# Patient Record
Sex: Male | Born: 1945 | Race: White | Marital: Married
Health system: Western US, Academic
[De-identification: ages and names within clinical notes are randomized; demographics above are authoritative.]

## PROBLEM LIST (undated history)

## (undated) DIAGNOSIS — I10 Essential (primary) hypertension: Secondary | ICD-10-CM

---

## 2016-01-10 ENCOUNTER — Encounter (HOSPITAL_COMMUNITY): Payer: Self-pay

## 2016-01-10 ENCOUNTER — Inpatient Hospital Stay (HOSPITAL_COMMUNITY)
Admission: EM | Admit: 2016-01-10 | Discharge: 2016-01-13 | DRG: 638 | Disposition: A | Discharge status: Home / Self Care | Payer: Medicare Other | Attending: Internal Medicine | Admitting: Internal Medicine

## 2016-01-10 DIAGNOSIS — N179 Acute kidney failure, unspecified: Secondary | ICD-10-CM | POA: Diagnosis present

## 2016-01-10 DIAGNOSIS — Z809 Family history of malignant neoplasm, unspecified: Secondary | ICD-10-CM

## 2016-01-10 DIAGNOSIS — I10 Essential (primary) hypertension: Secondary | ICD-10-CM | POA: Diagnosis present

## 2016-01-10 DIAGNOSIS — Z833 Family history of diabetes mellitus: Secondary | ICD-10-CM

## 2016-01-10 DIAGNOSIS — D638 Anemia in other chronic diseases classified elsewhere: Secondary | ICD-10-CM | POA: Diagnosis present

## 2016-01-10 DIAGNOSIS — I878 Other specified disorders of veins: Secondary | ICD-10-CM | POA: Diagnosis present

## 2016-01-10 DIAGNOSIS — I5030 Unspecified diastolic (congestive) heart failure: Secondary | ICD-10-CM | POA: Diagnosis present

## 2016-01-10 DIAGNOSIS — D509 Iron deficiency anemia, unspecified: Secondary | ICD-10-CM | POA: Diagnosis present

## 2016-01-10 DIAGNOSIS — I959 Hypotension, unspecified: Secondary | ICD-10-CM | POA: Diagnosis present

## 2016-01-10 DIAGNOSIS — R739 Hyperglycemia, unspecified: Secondary | ICD-10-CM

## 2016-01-10 DIAGNOSIS — R42 Dizziness and giddiness: Secondary | ICD-10-CM | POA: Diagnosis not present

## 2016-01-10 DIAGNOSIS — I110 Hypertensive heart disease with heart failure: Secondary | ICD-10-CM | POA: Diagnosis present

## 2016-01-10 DIAGNOSIS — R7989 Other specified abnormal findings of blood chemistry: Secondary | ICD-10-CM

## 2016-01-10 DIAGNOSIS — E860 Dehydration: Secondary | ICD-10-CM | POA: Diagnosis present

## 2016-01-10 DIAGNOSIS — E1100 Type 2 diabetes mellitus with hyperosmolarity without nonketotic hyperglycemic-hyperosmolar coma (NKHHC): Secondary | ICD-10-CM | POA: Diagnosis not present

## 2016-01-10 HISTORY — DX: Essential (primary) hypertension: I10

## 2016-01-10 LAB — BASIC METABOLIC PANEL
Anion gap: 10 (ref 5–15)
BUN: 43 mg/dL — AB (ref 6–20)
CALCIUM: 8.9 mg/dL (ref 8.9–10.3)
CHLORIDE: 85 mmol/L — AB (ref 101–111)
CO2: 29 mmol/L (ref 22–32)
CREATININE: 2.69 mg/dL — AB (ref 0.61–1.24)
GFR calc non Af Amer: 23 mL/min — ABNORMAL LOW (ref >60)
GFR, EST AFRICAN AMERICAN: 26 mL/min — AB (ref >60)
Glucose, Bld: 1170 mg/dL (ref 65–99)
Potassium: 5.4 mmol/L — ABNORMAL HIGH (ref 3.5–5.1)
SODIUM: 124 mmol/L — AB (ref 135–145)

## 2016-01-10 LAB — CBC
HEMATOCRIT: 35.9 % — AB (ref 39.0–52.0)
Hemoglobin: 11.8 g/dL — ABNORMAL LOW (ref 13.0–17.0)
MCH: 23.6 pg — ABNORMAL LOW (ref 26.0–34.0)
MCHC: 32.9 g/dL (ref 30.0–36.0)
MCV: 71.8 fL — AB (ref 78.0–100.0)
PLATELETS: 156 10*3/uL (ref 150–400)
RBC: 5 MIL/uL (ref 4.22–5.81)
WBC: 6.1 10*3/uL (ref 4.0–10.5)

## 2016-01-10 LAB — CBG MONITORING, ED

## 2016-01-10 MED ORDER — DEXTROSE-NACL 5-0.45 % IV SOLN
INTRAVENOUS | Status: DC
  Administered 2016-01-11 (×2): via INTRAVENOUS

## 2016-01-10 MED ORDER — SODIUM CHLORIDE 0.9 % IV BOLUS (SEPSIS)
1000.0000 mL | Freq: Once | INTRAVENOUS | Status: AC
  Administered 2016-01-10: 1000 mL via INTRAVENOUS

## 2016-01-10 MED ORDER — SODIUM CHLORIDE 0.9 % IV BOLUS (SEPSIS)
1000.0000 mL | Freq: Once | INTRAVENOUS | Status: AC
  Administered 2016-01-11: 1000 mL via INTRAVENOUS

## 2016-01-10 MED ORDER — SODIUM CHLORIDE 0.9 % IV SOLN
INTRAVENOUS | Status: DC
  Administered 2016-01-10: 5.4 [IU]/h via INTRAVENOUS
  Administered 2016-01-10: 10.8 [IU]/h via INTRAVENOUS
  Administered 2016-01-11: 16.2 [IU]/h via INTRAVENOUS
  Administered 2016-01-11: 21.6 [IU]/h via INTRAVENOUS
  Filled 2016-01-10: qty 2.5

## 2016-01-10 MED ORDER — SODIUM CHLORIDE 0.9 % IV SOLN: INTRAVENOUS | Status: DC

## 2016-01-10 NOTE — ED Provider Notes (Signed)
[facility]-EMERGENCY DEPT Provider Note   CSN: [id number] Arrival date & time: 01/10/16  2109   By signing my name below, I, Nathan Swanson, attest that this documentation has been prepared under the direction and in the presence of  Nathan FavorGail Nikky Duba, NP. Electronically Signed: Clovis PuAvnee Swanson, ED Scribe. 01/10/16. 10:42 PM.   History   Chief Complaint Chief Complaint  Patient presents with  . Hyperglycemia  . Dizziness   The history is provided by the patient and the spouse. No language interpreter was used.   HPI Comments:  Nathan Swanson is a 70 y.o. male, with a hx of HTN, who presents to the Emergency Department complaining of sudden onset, persistent dizziness x 1 week. Spouse notes associated frequent urination, excessive thirst, change in appetite and diaphoresis for 1 week. Pt presents with elevated blood sugar with a CBG reading of 1170.  Pt states he checked his blood sugar tonight for the first time and received a reading of "high" prompting his to visit the ED. Spouse notes a family hx of DM. Pt denies any recent illnesses, any other associated symptoms and modifying factors at this time.    Past Medical History:  Diagnosis Date  . Hypertension     There are no active problems to display for this patient.   No past surgical history on file.   Home Medications    Prior to Admission medications   Medication Sig Start Date End Date Taking? Authorizing Provider  Cholecalciferol (VITAMIN D PO) Take 1 tablet by mouth daily.   Yes Historical Provider, MD  latanoprost (XALATAN) 0.005 % ophthalmic solution Place 1 drop into both eyes at bedtime.   Yes Historical Provider, MD  LISINOPRIL PO Take 2 tablets by mouth daily.   Yes Historical Provider, MD  loratadine (CLARITIN) 10 MG tablet Take 10 mg by mouth daily.   Yes Historical Provider, MD  TIMOLOL MALEATE OP Place 1 drop into the left eye daily.   Yes Historical Provider, MD    Family History No family history on file.  Social  History Social History  Substance Use Topics  . Smoking status: Never Smoker  . Smokeless tobacco: Never Used  . Alcohol use Not on file     Allergies   Other   Review of Systems Review of Systems  Constitutional: Positive for appetite change and diaphoresis.  Endocrine: Positive for polydipsia.  Genitourinary: Positive for frequency.  Neurological: Positive for dizziness.   Physical Exam Updated Vital Signs BP 137/74 (BP Location: Left Arm)   Pulse 62   Temp 97.9 F (36.6 C) (Oral)   Resp 17   SpO2 95%   Physical Exam   ED Treatments / Results  DIAGNOSTIC STUDIES:  Oxygen Saturation is 97% on RA, normal by my interpretation.    COORDINATION OF CARE:  10:39 PM Discussed treatment plan with pt at bedside and pt agreed to plan.  Labs (all labs ordered are listed, but only abnormal results are displayed) Labs Reviewed  BASIC METABOLIC PANEL - Abnormal; Notable for the following:       Result Value   Sodium 124 (*)    Potassium 5.4 (*)    Chloride 85 (*)    Glucose, Bld 1,170 (*)    BUN 43 (*)    Creatinine, Ser 2.69 (*)    GFR calc non Af Amer 23 (*)    GFR calc Af Amer 26 (*)    All other components within normal limits  CBC - Abnormal; Notable for  the following:    Hemoglobin 11.8 (*)    HCT 35.9 (*)    MCV 71.8 (*)    MCH 23.6 (*)    All other components within normal limits  CBG MONITORING, ED - Abnormal; Notable for the following:    Glucose-Capillary >600 (*)    All other components within normal limits  URINALYSIS, ROUTINE W REFLEX MICROSCOPIC (NOT AT [facility])  BASIC METABOLIC PANEL  CBG MONITORING, ED    EKG  EKG Interpretation  Date/Time:  Saturday January 10 2016 21:56:18 EST Ventricular Rate:  74 PR Interval:    QRS Duration: 115 QT Interval:  443 QTC Calculation: 492 R Axis:   61 Text Interpretation:  Sinus rhythm Borderline prolonged PR interval Nonspecific intraventricular conduction delay ST elevation suggests acute pericarditis  Confirmed by Nathan Swanson, Liz [phone]([id number]) on 01/10/2016 10:54:40 PM       Radiology No results found.  Procedures .Critical Care Performed by: Nathan FavorSCHULZ, Nathan Swanson Authorized by: Nathan FavorSCHULZ, Nathan Swanson   Critical care provider statement:    Critical care time (minutes):  30   Critical care start time:  01/10/2016 10:42 PM   Critical care end time:  01/11/2016 1:00 AM   Critical care was necessary to treat or prevent imminent or life-threatening deterioration of the following conditions:  Dehydration and endocrine crisis   Critical care was time spent personally by me on the following activities:  Development of treatment plan with patient or surrogate, examination of patient, obtaining history from patient or surrogate, ordering and review of laboratory studies and re-evaluation of patient's condition   I assumed direction of critical care for this patient from another provider in my specialty: yes     (including critical care time)  Medications Ordered in ED Medications  insulin regular (NOVOLIN R,HUMULIN R) 250 Units in sodium chloride 0.9 % 250 mL (1 Units/mL) infusion (10.8 Units/hr Intravenous New Bag/Given 01/10/16 2359)  sodium chloride 0.9 % bolus 1,000 mL (0 mLs Intravenous Stopped 01/10/16 2345)    And  sodium chloride 0.9 % bolus 1,000 mL (1,000 mLs Intravenous New Bag/Given 01/11/16 0000)    And  0.9 %  sodium chloride infusion (not administered)  dextrose 5 %-0.45 % sodium chloride infusion (not administered)     Initial Impression / Assessment and Plan / ED Course  I have reviewed the triage vital signs and the nursing notes.  Pertinent labs & imaging results that were available during my care of the patient were reviewed by me and considered in my medical decision making (see chart for details).  Clinical Course    Name Nathan Swanson, diabetic patient has been immediately started on hydration and Glucomander.  He'll be admitted to hospital for continued hydration and monitoring.  Also of note,  his creatinine level is elevated at 2.69 with a BUN of 43.  He is new to the area and only visiting from [place], so I do not have a baseline for this patient    Final Clinical Impressions(s) / ED Diagnoses   Final diagnoses:  Hyperglycemia  Dizziness    New Prescriptions New Prescriptions   No medications on file   I personally performed the services described in this documentation, which was scribed in my presence. The recorded information has been reviewed and is accurate.    Nathan FavorGail Tiarna Koppen, NP 01/11/16 0102    Tilden FossaElizabeth Rees, MD 01/11/16 [phone]

## 2016-01-10 NOTE — ED Notes (Signed)
Bed: [id number] Expected date:  Expected time:  Means of arrival:  Comments: TR 1 when clean. CBG 1170

## 2016-01-10 NOTE — ED Notes (Signed)
E[facility] given to [facility],Rees,MD., for review. Pt. CBG reading high on glucose meter.

## 2016-01-10 NOTE — ED Triage Notes (Signed)
Pt states that he has been experiencing dizziness over the last week. Pt has not been taking his blood sugar until tonight when it read "HI." PT A&Ox4. Pt also states that he has blurry vision that has been going on for months, but his uses reading glasses that help. Endorses unsteady gait and balance also starting a week ago.

## 2016-01-11 ENCOUNTER — Inpatient Hospital Stay (HOSPITAL_COMMUNITY): Payer: Medicare Other

## 2016-01-11 ENCOUNTER — Encounter (HOSPITAL_COMMUNITY): Payer: Self-pay | Admitting: Family Medicine

## 2016-01-11 DIAGNOSIS — I878 Other specified disorders of veins: Secondary | ICD-10-CM | POA: Diagnosis present

## 2016-01-11 DIAGNOSIS — D509 Iron deficiency anemia, unspecified: Secondary | ICD-10-CM | POA: Diagnosis present

## 2016-01-11 DIAGNOSIS — I10 Essential (primary) hypertension: Secondary | ICD-10-CM | POA: Diagnosis present

## 2016-01-11 DIAGNOSIS — E1100 Type 2 diabetes mellitus with hyperosmolarity without nonketotic hyperglycemic-hyperosmolar coma (NKHHC): Secondary | ICD-10-CM | POA: Diagnosis present

## 2016-01-11 DIAGNOSIS — N179 Acute kidney failure, unspecified: Secondary | ICD-10-CM | POA: Diagnosis present

## 2016-01-11 DIAGNOSIS — I110 Hypertensive heart disease with heart failure: Secondary | ICD-10-CM | POA: Diagnosis present

## 2016-01-11 DIAGNOSIS — R42 Dizziness and giddiness: Secondary | ICD-10-CM | POA: Diagnosis present

## 2016-01-11 DIAGNOSIS — I5030 Unspecified diastolic (congestive) heart failure: Secondary | ICD-10-CM | POA: Diagnosis present

## 2016-01-11 DIAGNOSIS — E860 Dehydration: Secondary | ICD-10-CM | POA: Diagnosis present

## 2016-01-11 DIAGNOSIS — I959 Hypotension, unspecified: Secondary | ICD-10-CM | POA: Diagnosis present

## 2016-01-11 DIAGNOSIS — M7989 Other specified soft tissue disorders: Secondary | ICD-10-CM | POA: Diagnosis not present

## 2016-01-11 DIAGNOSIS — I509 Heart failure, unspecified: Secondary | ICD-10-CM | POA: Diagnosis not present

## 2016-01-11 DIAGNOSIS — Z809 Family history of malignant neoplasm, unspecified: Secondary | ICD-10-CM | POA: Diagnosis not present

## 2016-01-11 DIAGNOSIS — Z833 Family history of diabetes mellitus: Secondary | ICD-10-CM | POA: Diagnosis not present

## 2016-01-11 DIAGNOSIS — D638 Anemia in other chronic diseases classified elsewhere: Secondary | ICD-10-CM | POA: Diagnosis present

## 2016-01-11 LAB — URINE MICROSCOPIC-ADD ON
BACTERIA UA: NONE SEEN
WBC, UA: NONE SEEN WBC/hpf (ref 0–5)

## 2016-01-11 LAB — GLUCOSE, CAPILLARY
GLUCOSE-CAPILLARY: 198 mg/dL — AB (ref 65–99)
GLUCOSE-CAPILLARY: 214 mg/dL — AB (ref 65–99)
GLUCOSE-CAPILLARY: 235 mg/dL — AB (ref 65–99)
Glucose-Capillary: 368 mg/dL — ABNORMAL HIGH (ref 65–99)
Glucose-Capillary: 409 mg/dL — ABNORMAL HIGH (ref 65–99)
Glucose-Capillary: 211 mg/dL — ABNORMAL HIGH (ref 65–99)
Glucose-Capillary: 152 mg/dL — ABNORMAL HIGH (ref 65–99)
Glucose-Capillary: 179 mg/dL — ABNORMAL HIGH (ref 65–99)
Glucose-Capillary: 117 mg/dL — ABNORMAL HIGH (ref 65–99)
Glucose-Capillary: 278 mg/dL — ABNORMAL HIGH (ref 65–99)

## 2016-01-11 LAB — BASIC METABOLIC PANEL
ANION GAP: 9 (ref 5–15)
Anion gap: 9 (ref 5–15)
BUN: 41 mg/dL — ABNORMAL HIGH (ref 6–20)
BUN: 39 mg/dL — ABNORMAL HIGH (ref 6–20)
CALCIUM: 8.6 mg/dL — AB (ref 8.9–10.3)
CALCIUM: 8.8 mg/dL — AB (ref 8.9–10.3)
CO2: 27 mmol/L (ref 22–32)
CO2: 30 mmol/L (ref 22–32)
CREATININE: 2.71 mg/dL — AB (ref 0.61–1.24)
Chloride: 94 mmol/L — ABNORMAL LOW (ref 101–111)
Chloride: 106 mmol/L (ref 101–111)
Creatinine, Ser: 2.6 mg/dL — ABNORMAL HIGH (ref 0.61–1.24)
GFR calc non Af Amer: 23 mL/min — ABNORMAL LOW (ref >60)
GFR, EST AFRICAN AMERICAN: 26 mL/min — AB (ref >60)
GFR, EST AFRICAN AMERICAN: 27 mL/min — AB (ref >60)
GFR, EST NON AFRICAN AMERICAN: 24 mL/min — AB (ref >60)
GLUCOSE: 149 mg/dL — AB (ref 65–99)
Glucose, Bld: 842 mg/dL (ref 65–99)
Potassium: 4.6 mmol/L (ref 3.5–5.1)
Potassium: 3.3 mmol/L — ABNORMAL LOW (ref 3.5–5.1)
Sodium: 133 mmol/L — ABNORMAL LOW (ref 135–145)
Sodium: 142 mmol/L (ref 135–145)

## 2016-01-11 LAB — URINALYSIS, ROUTINE W REFLEX MICROSCOPIC
Bilirubin Urine: NEGATIVE
Ketones, ur: NEGATIVE mg/dL
LEUKOCYTES UA: NEGATIVE
Nitrite: NEGATIVE
PROTEIN: NEGATIVE mg/dL
SPECIFIC GRAVITY, URINE: 1.03 (ref 1.005–1.030)
pH: 6 (ref 5.0–8.0)

## 2016-01-11 LAB — FERRITIN: Ferritin: 909 ng/mL — ABNORMAL HIGH (ref 24–336)

## 2016-01-11 LAB — MAGNESIUM: Magnesium: 3.1 mg/dL — ABNORMAL HIGH (ref 1.7–2.4)

## 2016-01-11 LAB — CBG MONITORING, ED: Glucose-Capillary: >600 mg/dL (ref 65–99)

## 2016-01-11 MED ORDER — SODIUM CHLORIDE 0.9 % IV BOLUS (SEPSIS)
1000.0000 mL | Freq: Once | INTRAVENOUS | Status: AC
  Administered 2016-01-11: 1000 mL via INTRAVENOUS

## 2016-01-11 MED ORDER — SODIUM CHLORIDE 0.9 % IV SOLN
INTRAVENOUS | Status: DC
  Administered 2016-01-11: 07:00:00 via INTRAVENOUS

## 2016-01-11 MED ORDER — DEXTROSE-NACL 5-0.45 % IV SOLN
INTRAVENOUS | Status: DC
  Administered 2016-01-11: 125 mL via INTRAVENOUS

## 2016-01-11 MED ORDER — INSULIN REGULAR BOLUS VIA INFUSION
0.0000 [IU] | Freq: Three times a day (TID) | INTRAVENOUS | Status: DC
  Filled 2016-01-11: qty 10

## 2016-01-11 MED ORDER — INSULIN ASPART 100 UNIT/ML ~~LOC~~ SOLN
3.0000 [IU] | Freq: Three times a day (TID) | SUBCUTANEOUS | Status: DC
  Administered 2016-01-11 – 2016-01-13 (×7): 3 [IU] via SUBCUTANEOUS

## 2016-01-11 MED ORDER — SODIUM CHLORIDE 0.9 % IV SOLN
INTRAVENOUS | Status: AC
  Administered 2016-01-11 – 2016-01-12 (×3): via INTRAVENOUS

## 2016-01-11 MED ORDER — INSULIN GLARGINE 100 UNIT/ML ~~LOC~~ SOLN
25.0000 [IU] | Freq: Two times a day (BID) | SUBCUTANEOUS | Status: DC
  Administered 2016-01-11 – 2016-01-12 (×2): 25 [IU] via SUBCUTANEOUS
  Filled 2016-01-11 (×2): qty 0.25

## 2016-01-11 MED ORDER — ONDANSETRON HCL 4 MG PO TABS: 4.0000 mg | ORAL_TABLET | Freq: Four times a day (QID) | ORAL | Status: DC | PRN

## 2016-01-11 MED ORDER — SODIUM CHLORIDE 0.9 % IV SOLN
INTRAVENOUS | Status: DC
  Administered 2016-01-11: 8.3 [IU]/h via INTRAVENOUS
  Filled 2016-01-11: qty 2.5

## 2016-01-11 MED ORDER — SENNOSIDES-DOCUSATE SODIUM 8.6-50 MG PO TABS
1.0000 | ORAL_TABLET | Freq: Two times a day (BID) | ORAL | Status: DC
  Administered 2016-01-11: 1 via ORAL
  Filled 2016-01-11 (×4): qty 1

## 2016-01-11 MED ORDER — INSULIN GLARGINE 100 UNIT/ML ~~LOC~~ SOLN
20.0000 [IU] | Freq: Two times a day (BID) | SUBCUTANEOUS | Status: DC
Start: 2016-01-11 — End: 2016-01-11
  Administered 2016-01-11: 20 [IU] via SUBCUTANEOUS
  Filled 2016-01-11 (×2): qty 0.2

## 2016-01-11 MED ORDER — INSULIN ASPART 100 UNIT/ML ~~LOC~~ SOLN
0.0000 [IU] | Freq: Every day | SUBCUTANEOUS | Status: DC
  Administered 2016-01-11: 3 [IU] via SUBCUTANEOUS

## 2016-01-11 MED ORDER — ENOXAPARIN SODIUM 40 MG/0.4ML ~~LOC~~ SOLN
40.0000 mg | SUBCUTANEOUS | Status: DC
  Administered 2016-01-11: 40 mg via SUBCUTANEOUS
  Filled 2016-01-11: qty 0.4

## 2016-01-11 MED ORDER — ACETAMINOPHEN 325 MG PO TABS: 650.0000 mg | ORAL_TABLET | Freq: Four times a day (QID) | ORAL | Status: DC | PRN

## 2016-01-11 MED ORDER — INSULIN ASPART 100 UNIT/ML ~~LOC~~ SOLN
0.0000 [IU] | Freq: Three times a day (TID) | SUBCUTANEOUS | Status: DC
  Administered 2016-01-11: 7 [IU] via SUBCUTANEOUS
  Administered 2016-01-12: 15 [IU] via SUBCUTANEOUS
  Administered 2016-01-12: 11 [IU] via SUBCUTANEOUS
  Administered 2016-01-12: 4 [IU] via SUBCUTANEOUS
  Administered 2016-01-13: 7 [IU] via SUBCUTANEOUS
  Administered 2016-01-13: 4 [IU] via SUBCUTANEOUS
  Administered 2016-01-13: 15 [IU] via SUBCUTANEOUS

## 2016-01-11 MED ORDER — ONDANSETRON HCL 4 MG/2ML IJ SOLN: 4.0000 mg | Freq: Four times a day (QID) | INTRAMUSCULAR | Status: DC | PRN

## 2016-01-11 MED ORDER — ACETAMINOPHEN 650 MG RE SUPP: 650.0000 mg | Freq: Four times a day (QID) | RECTAL | Status: DC | PRN

## 2016-01-11 MED ORDER — DEXTROSE 50 % IV SOLN: 25.0000 mL | INTRAVENOUS | Status: DC | PRN

## 2016-01-11 NOTE — Significant Event (Signed)
Rapid Response Event Note  Overview: Time Called: 0445 Arrival Time: 0447 Event Type: Unknown  Initial Focused Assessment: Called to patient room for a syncopal episode. On arrival patient was in bed, no injury noted. He was responsive to his name but very drowsy. He was oriented to self and place. Patient placed on the rapid response monitor, BP 41/22, HR 58, RR 12. He was diaphoretic. CBG 368.  Interventions: Order received for 2L bolus. Plan of Care (if not transferred):  Event Summary: Name of Physician Notified: Dr. Isidoro Donningai at      at  [phone]  Outcome: Transferred (Comment) patient transferred to ICU unit for further monitoring.  Event End Time: 0530  Nathan Swanson, Lilia ProQueeneth Chibuzo

## 2016-01-11 NOTE — H&P (Addendum)
History and Physical  Patient Name: Nathan Swanson     ZOX:[id number]    DOB: [birth year]    DOA: 01/10/2016 PCP: [facility] in [place]   Patient coming from: Friend's house  Chief Complaint: Weakness  HPI: Terran Klinke is a 70 y.o. male with a past medical history significant for HTN who presents with 1 week polyuria/polydipsia and weakness/dizziness.  The patient is visiting friends and family, lives in [place].  Over the last 1-2 weeks, he has noticed increased thirst and urination, dry mouth, and blurry vision.  In the last few days, this has progressed until he feels unsteady and dizzy on his feet and is weak.  Today, his girlfriend took his BG on her home glucometer and it read "HIGH" so they came to the ER.  ED course: -Afebrile, heart rate 70s, respirations and pulse ox normal, blood pressure 105/72 -Na 124, K 5.4, Cr 2.69 (baseline unknown), WBC 6.1 K, Hgb 11.2 and microcytic -Glucose 1170, bicarbonate normal, Anion gap Normal -He was given 2L NS and started on insulin drip and TRH were asked to evaluate for new onset diabetes    He has no previous history of diabetes.  "Everyone" in his family has diabetes.  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         ROS: Review of Systems  Constitutional: Negative for chills, fever, malaise/fatigue and weight loss.  Eyes: Positive for blurred vision.  Cardiovascular: Negative for chest pain.  Gastrointestinal: Negative for abdominal pain, nausea and vomiting.  Neurological: Positive for dizziness and weakness.  Endo/Heme/Allergies: Positive for polydipsia.  All other systems reviewed and are negative.         Past Medical History:  Diagnosis Date  . Hypertension     History reviewed. No pertinent surgical history.  Social History: Patient lives by himself in [place], manages fitness programs.  Still works.  The patient walks unassisted.  He does not smoke.  Drinks maybe once per week.  From  [place] originally.    Allergies  Allergen Reactions  . Other Anaphylaxis    [place] nuts and Soy    Family history: family history includes Cancer in his father; Diabetes in his brother and sister.  Prior to Admission medications   Medication Sig Start Date End Date Taking? Authorizing Provider  Cholecalciferol (VITAMIN D PO) Take 1 tablet by mouth daily.   Yes Historical Provider, MD  latanoprost (XALATAN) 0.005 % ophthalmic solution Place 1 drop into both eyes at bedtime.   Yes Historical Provider, MD  LISINOPRIL PO Take 2 tablets by mouth daily.   Yes Historical Provider, MD  loratadine (CLARITIN) 10 MG tablet Take 10 mg by mouth daily.   Yes Historical Provider, MD  TIMOLOL MALEATE OP Place 1 drop into the left eye daily.   Yes Historical Provider, MD       Physical Exam: BP 137/74 (BP Location: Left Arm)   Pulse 62   Temp 97.9 F (36.6 C) (Oral)   Resp 17   SpO2 95%  General appearance: Well-developed, adult male, alert and in no acute distress.   Eyes: Anicteric, conjunctiva pink, lids and lashes normal. PERRL.    ENT: No nasal deformity, discharge, epistaxis.  Hearing normal. OP with dry MM without lesions.   Skin: Warm and dry.  No jaundice.  No suspicious rashes or lesions. Cardiac: RRR, nl S1-S2, no murmurs appreciated.  Capillary refill is brisk.  JVP normal.  No LE edema.  Radial and DP  pulses 2+ and symmetric. Respiratory: Normal respiratory rate and rhythm.  CTAB without rales or wheezes. Abdomen: Abdomen soft.  No TTP. No ascites, distension, hepatosplenomegaly.   MSK: No deformities or effusions.  No cyanosis or clubbing. Neuro: Cranial nerves 3-12 intact.  Sensation intact to light touch. Speech is fluent.  Muscle strength normal.    Psych: Sensorium intact and responding to questions, attention normal.  Behavior appropriate.  Affect normal.  Judgment and insight appear normal.     Labs on Admission:  I have personally reviewed following labs and imaging  studies: CBC:  Recent Labs Lab 01/10/16 2208  WBC 6.1  HGB 11.8*  HCT 35.9*  MCV 71.8*  PLT 156   Basic Metabolic Panel:  Recent Labs Lab 01/10/16 2142 01/11/16 0056  NA 124* 133*  K 5.4* 4.6  CL 85* 94*  CO2 29 30  GLUCOSE 1,170* 842*  BUN 43* 41*  CREATININE 2.69* 2.60*  CALCIUM 8.9 8.8*   GFR: CrCl cannot be calculated (Unknown ideal weight.).  Liver Function Tests: No results for input(s): AST, ALT, ALKPHOS, BILITOT, PROT, ALBUMIN in the last 168 hours. No results for input(s): LIPASE, AMYLASE in the last 168 hours. No results for input(s): AMMONIA in the last 168 hours. Coagulation Profile: No results for input(s): INR, PROTIME in the last 168 hours. Cardiac Enzymes: No results for input(s): CKTOTAL, CKMB, CKMBINDEX, TROPONINI in the last 168 hours. BNP (last 3 results) No results for input(s): PROBNP in the last 8760 hours. HbA1C: No results for input(s): HGBA1C in the last 72 hours. CBG:  Recent Labs Lab 01/10/16 2355 01/11/16 0102  GLUCAP >600* >600*   Lipid Profile: No results for input(s): CHOL, HDL, LDLCALC, TRIG, CHOLHDL, LDLDIRECT in the last 72 hours. Thyroid Function Tests: No results for input(s): TSH, T4TOTAL, FREET4, T3FREE, THYROIDAB in the last 72 hours. Anemia Panel: No results for input(s): VITAMINB12, FOLATE, FERRITIN, TIBC, IRON, RETICCTPCT in the last 72 hours. Sepsis Labs: Invalid input(s): PROCALCITONIN, LACTICIDVEN No results found for this or any previous visit (from the past 240 hour(s)).        ECG: Personally reviewed, rate 74, QTc 492, sinus rhythm, diffuse concave up ST segment elevation, no chest pain.      Assessment/Plan  1. New onset diabetes, with hyperglycemia without coma:  -Recommend admission to med surg unit -Insulin gtt -Aggressive IVF -BMP now and repeat in 4 hours    2. HTN:  -Hold lisinopril given AKI  3. Acute kidney injury, presumed:  No baseline available. -Fluids -Check  urinalysis -Trend BMP  4. Anemia, microcytic:  Presumed iron deficiency. -Check ferritin -[facility] [place]         DVT prophylaxis: Lovenox  Code Status: FULL  Family Communication: Girlfriend at bedside  Disposition Plan: Anticipate IV insulin and monitor BG overnight, close monitoring of electrolytes and renal function. Consults called: None Admission status: INPATIENT       Medical decision making: Patient seen at 1:00 AM on 01/11/2016.  The patient was discussed with Earley FavorGail Schulz, PA-C.  What exists of the patient's chart was reviewed in depth and summarized above.  Clinical condition: requiring frequent lab monitoring and hourly glucose checks.        Alberteen SamChristopher P Melton Walls [facility] Pager [phone]      At the time of admission, it appears that the appropriate admission status for this patient is INPATIENT. This is judged to be reasonable and necessary in order to provide the required  intensity of service to ensure the patient's safety given the presenting symptoms, physical exam findings, and initial radiographic and laboratory data in the context of their chronic comorbidities.  Together, these circumstances are felt to place her/him at high risk for further clinical deterioration threatening life, limb, or organ. The following factors support the admission status of inpatient:   A. The patient's presenting symptoms include weaknes, dizziness, polyuria and polydipsia B. The worrisome physical exam findings include dry mouth C. The initial radiographic and laboratory data are worrisome because of acute kidney injury, microcytic anemia, severe hyperglycemia D. The chronic co-morbidities include hypertension E. Patient requires inpatient status due to high intensity of service, high risk for further deterioration and high frequency of surveillance required because of this acute illness that poses a threat to life or bodily function. F. I certify  that at the point of admission it is my clinical judgment that the patient will require inpatient hospital care spanning beyond 2 midnights from the point of admission and that early discharge would result in unnecessary risk of decompensation and readmission or threat to life, limb or bodily function.

## 2016-01-11 NOTE — Progress Notes (Addendum)
PROGRESS NOTE  Modena MorrowWillie Mckesson NWG:[id number] DOB: 05-Dec-1945 DOA: 01/10/2016 PCP: No PCP Per Patient  Brief summary:  [place] Due to progressive weakness, came to ED found to have hyperglycemia   HPI/Recap of past 24 hours:  Feeling better, fully awake, NAD, denies pain,  Report has long history of hypertension, has chronic intermittent bilateral lower extremity edema He was evaluated by cardiac stress test for chest pain a few years ago, he report the result was unremarkable,  He report he had blood work done a few months ago was unremarkable  Assessment/Plan: Principal Problem:   Type 2 diabetes mellitus with hyperosmolarity without coma, without long-term current use of insulin (HCC) Active Problems:   Essential hypertension   Microcytic anemia   AKI (acute kidney injury) (HCC)  Diabetes (new diagnosis), presented with hyperglycemia (Blood sugar 1170 on presentation) without coma,  No DKA He is severely dehydrated and became hypotensive and collapsed back onto the bed while getting out of bed and he was difficult to arouse for about 10seconds after this episode He is stared on insulin drip and received total of 5liters of ns boluses and on continued ivf Blood sugar improving,   AKI vs aki on ckd, unknown baseline cr ua  Specific gravity 1.03, +glucose, no protein, no keton, no infection Renal us pending, Strict intake and output, Continue ivf, renal dosing meds  Bilateral lower extremity edema:  With chronic venous stasis changes, skin pigmentation lower extremity Will check venous doppler and echocardiogram  Long history of HTN,  presented with significant dehydration, hypotension, hold lisinopril, continue ivf  Microcytic anemia:   unkonwn basline, mcv 71.8 hgb 11.8 suspect will drop with hydration Will check iron panel, b12, folate, tsh, retic, stool occult blood  Will defer hemoglobinopathy testing, and spep  /upep to [place]md in [place].    Code Status: full  Family Communication: patient and daughter  Disposition Plan: likely able to transfer out of stepdown to med tele this afternoon   Consultants:  Diabetes RN and dietician  Procedures:  none  Antibiotics:  none   Objective: BP 105/67   Pulse 61   Temp 97.7 F (36.5 C) (Oral)   Resp 12   Ht 6\' 2"  (1.88 m)   SpO2 100%   Intake/Output Summary (Last 24 hours) at 01/11/16 0845 Last data filed at 01/11/16 [id number]  Gross per 24 hour  Intake             3325 ml  Output                0 ml  Net             3325 ml   There were no vitals filed for this visit.  Exam:   General:  NAD  Cardiovascular: RRR  Respiratory: CTABL  Abdomen: Soft/ND/NT, positive BS  Musculoskeletal: chronic venous stasis changes with dark pigmentation and skin thickening, worse on left lower extremity  Neuro: aaox3  Data Reviewed: Basic Metabolic Panel:  Recent Labs Lab 01/10/16 2142 01/11/16 0056 01/11/16 0646  NA 124* 133* 142  K 5.4* 4.6 3.3*  CL 85* 94* 106  CO2 29 30 27   GLUCOSE 1,170* 842* 149*  BUN 43* 41* 39*  CREATININE 2.69* 2.60* 2.71*  CALCIUM 8.9 8.8* 8.6*  MG  --   --  3.1*   Liver Function Tests: No results for input(s): AST, ALT, ALKPHOS, BILITOT, PROT, ALBUMIN in the last 168 hours. No results  for input(s): LIPASE, AMYLASE in the last 168 hours. No results for input(s): AMMONIA in the last 168 hours. CBC:  Recent Labs Lab 01/10/16 2208  WBC 6.1  HGB 11.8*  HCT 35.9*  MCV 71.8*  PLT 156   Cardiac Enzymes:   No results for input(s): CKTOTAL, CKMB, CKMBINDEX, TROPONINI in the last 168 hours. BNP (last 3 results) No results for input(s): BNP in the last 8760 hours.  ProBNP (last 3 results) No results for input(s): PROBNP in the last 8760 hours.  CBG:  Recent Labs Lab 01/11/16 0319 01/11/16 0453 01/11/16 0551 01/11/16 0656 01/11/16 0802  GLUCAP >600* 368* 409* 211* 198*    No results  found for this or any previous visit (from the past 240 hour(s)).   Studies: No results found.  Scheduled Meds: . enoxaparin (LOVENOX) injection  40 mg Subcutaneous Q24H  . insulin regular  0-10 Units Intravenous TID WC  . sodium chloride  1,000 mL Intravenous Once    Continuous Infusions: . sodium chloride Stopped (01/11/16 0705)  . dextrose 5 % and 0.45% NaCl 125 mL (01/11/16 0704)  . insulin (NOVOLIN-R) infusion       Time spent: 35mins from 10am to 10:35am  Emilija Bohman MD, PhD  [facility] Pager [phone]. If 7PM-7AM, please contact night-coverage at [email or web address], password [email or web address] 01/11/2016, 8:45 AM  LOS: 0 days

## 2016-01-11 NOTE — Progress Notes (Signed)
Checked the pt's. Vitals and his bp was 77/44. Paged the oncall Dr. Who ordered a liter bolus of NS when I went to start the bolus the patient was trying to get out of bed stating he had to use the restroom I yelled for help and the charge nurse came in. When he went to stand up he collapsed back onto the bed and was difficult to arouse for about 10 seconds. When he came to he was very diaphoretic and  clammy and his mental status was impaired. [facility]. PCT the Md. oncall to let her know what was going on and to see about getting an order to move him to a higher level of acuity. Order obtained and the pt. Moved.

## 2016-01-12 ENCOUNTER — Inpatient Hospital Stay (HOSPITAL_COMMUNITY): Payer: Medicare Other

## 2016-01-12 DIAGNOSIS — I509 Heart failure, unspecified: Secondary | ICD-10-CM

## 2016-01-12 DIAGNOSIS — M7989 Other specified soft tissue disorders: Secondary | ICD-10-CM

## 2016-01-12 LAB — CBC WITH DIFFERENTIAL/PLATELET
BASOS ABS: 0 10*3/uL (ref 0.0–0.1)
BASOS PCT: 0 %
EOS ABS: 0.1 10*3/uL (ref 0.0–0.7)
Eosinophils Relative: 2 %
HEMATOCRIT: 34.6 % — AB (ref 39.0–52.0)
HEMOGLOBIN: 10.9 g/dL — AB (ref 13.0–17.0)
Lymphocytes Relative: 43 %
Lymphs Abs: 2.4 10*3/uL (ref 0.7–4.0)
MCH: 23.7 pg — ABNORMAL LOW (ref 26.0–34.0)
MCHC: 31.5 g/dL (ref 30.0–36.0)
MCV: 75.2 fL — ABNORMAL LOW (ref 78.0–100.0)
MONOS PCT: 6 %
Monocytes Absolute: 0.4 10*3/uL (ref 0.1–1.0)
NEUTROS ABS: 2.8 10*3/uL (ref 1.7–7.7)
NEUTROS PCT: 49 %
Platelets: 126 10*3/uL — ABNORMAL LOW (ref 150–400)
RBC: 4.6 MIL/uL (ref 4.22–5.81)
RDW: 13 % (ref 11.5–15.5)
WBC: 5.7 10*3/uL (ref 4.0–10.5)

## 2016-01-12 LAB — COMPREHENSIVE METABOLIC PANEL
ALBUMIN: 3.5 g/dL (ref 3.5–5.0)
ALK PHOS: 69 U/L (ref 38–126)
ALT: 26 U/L (ref 17–63)
ANION GAP: 6 (ref 5–15)
AST: 28 U/L (ref 15–41)
BILIRUBIN TOTAL: 0.7 mg/dL (ref 0.3–1.2)
BUN: 33 mg/dL — AB (ref 6–20)
CALCIUM: 8.2 mg/dL — AB (ref 8.9–10.3)
CO2: 27 mmol/L (ref 22–32)
CREATININE: 1.76 mg/dL — AB (ref 0.61–1.24)
Chloride: 105 mmol/L (ref 101–111)
GFR calc Af Amer: 44 mL/min — ABNORMAL LOW (ref >60)
GFR calc non Af Amer: 38 mL/min — ABNORMAL LOW (ref >60)
GLUCOSE: 307 mg/dL — AB (ref 65–99)
Potassium: 3.5 mmol/L (ref 3.5–5.1)
SODIUM: 138 mmol/L (ref 135–145)
TOTAL PROTEIN: 6.8 g/dL (ref 6.5–8.1)

## 2016-01-12 LAB — IRON AND TIBC
Iron: 70 ug/dL (ref 45–182)
SATURATION RATIOS: 31 % (ref 17.9–39.5)
TIBC: 224 ug/dL — ABNORMAL LOW (ref 250–450)
UIBC: 154 ug/dL

## 2016-01-12 LAB — LIPID PANEL
Cholesterol: 151 mg/dL (ref 0–200)
HDL: 16 mg/dL — ABNORMAL LOW (ref >40)
LDL CALC: 57 mg/dL (ref 0–99)
TRIGLYCERIDES: 388 mg/dL — AB (ref <150)
Total CHOL/HDL Ratio: 9.4 RATIO
VLDL: 78 mg/dL — AB (ref 0–40)

## 2016-01-12 LAB — HEMOGLOBIN A1C
Hgb A1c MFr Bld: 14.9 % — ABNORMAL HIGH (ref 4.8–5.6)
Mean Plasma Glucose: 381 mg/dL

## 2016-01-12 LAB — PROTIME-INR
INR: 0.99
PROTHROMBIN TIME: 13.1 s (ref 11.4–15.2)

## 2016-01-12 LAB — GLUCOSE, CAPILLARY
GLUCOSE-CAPILLARY: 261 mg/dL — AB (ref 65–99)
GLUCOSE-CAPILLARY: 318 mg/dL — AB (ref 65–99)
Glucose-Capillary: 180 mg/dL — ABNORMAL HIGH (ref 65–99)
Glucose-Capillary: 174 mg/dL — ABNORMAL HIGH (ref 65–99)

## 2016-01-12 LAB — TSH: TSH: 0.486 u[IU]/mL (ref 0.350–4.500)

## 2016-01-12 LAB — RETICULOCYTES
RBC.: 4.6 MIL/uL (ref 4.22–5.81)
RETIC COUNT ABSOLUTE: 32.2 10*3/uL (ref 19.0–186.0)
RETIC CT PCT: 0.7 % (ref 0.4–3.1)

## 2016-01-12 LAB — ECHOCARDIOGRAM COMPLETE
HEIGHTINCHES: 74 in
Weight: 4544 oz

## 2016-01-12 LAB — VITAMIN B12: VITAMIN B 12: 1345 pg/mL — AB (ref 180–914)

## 2016-01-12 LAB — CREATININE, URINE, RANDOM: CREATININE, URINE: 39.79 mg/dL

## 2016-01-12 LAB — SODIUM, URINE, RANDOM: SODIUM UR: 37 mmol/L

## 2016-01-12 LAB — FOLATE: FOLATE: 18.6 ng/mL (ref >5.9)

## 2016-01-12 MED ORDER — ATORVASTATIN CALCIUM 20 MG PO TABS
20.0000 mg | ORAL_TABLET | Freq: Every day | ORAL | Status: DC
  Administered 2016-01-12: 20 mg via ORAL
  Filled 2016-01-12: qty 1

## 2016-01-12 MED ORDER — LIVING WELL WITH DIABETES BOOK
Freq: Once | Status: AC
  Administered 2016-01-12: 1
  Filled 2016-01-12: qty 1

## 2016-01-12 MED ORDER — INSULIN STARTER KIT- PEN NEEDLES (ENGLISH)
1.0000 | Freq: Once | Status: AC
  Administered 2016-01-12: 1
  Filled 2016-01-12: qty 1

## 2016-01-12 MED ORDER — INSULIN GLARGINE 100 UNIT/ML ~~LOC~~ SOLN
35.0000 [IU] | Freq: Two times a day (BID) | SUBCUTANEOUS | Status: DC
Start: 2016-01-12 — End: 2016-01-13
  Administered 2016-01-12 – 2016-01-13 (×2): 35 [IU] via SUBCUTANEOUS
  Filled 2016-01-12 (×3): qty 0.35

## 2016-01-12 MED ORDER — SODIUM CHLORIDE 0.9 % IV SOLN
INTRAVENOUS | Status: AC
Start: 2016-01-12 — End: 2016-01-13

## 2016-01-12 MED ORDER — HYDRALAZINE HCL 20 MG/ML IJ SOLN: 5.0000 mg | INTRAMUSCULAR | Status: DC | PRN

## 2016-01-12 MED ORDER — ENOXAPARIN SODIUM 60 MG/0.6ML ~~LOC~~ SOLN
60.0000 mg | Freq: Every day | SUBCUTANEOUS | Status: DC
  Administered 2016-01-12 – 2016-01-13 (×2): 60 mg via SUBCUTANEOUS
  Filled 2016-01-12 (×2): qty 0.6

## 2016-01-12 NOTE — Progress Notes (Signed)
[facility] PROGRESS NOTE    Progress Note  Nathan Swanson  XKG:[id number] DOB: 03/25/1945 DOA: 01/10/2016 PCP: No PCP Per Patient     Brief Narrative:   Nathan Swanson is an 70 y.o. male who is in [place] and was here for the holidays with past medical history of hypertension with polyuria and polydipsia for 1 week that came in with hyperosmolar nonketotic doses hyperglycemia glucose came down from 1100-208 hours on the blue command.  Assessment/Plan:   Type 2 diabetes mellitus with hyperosmolarity without coma, without long-term current use of insulin (HCC) Blood is improved but is trending up, will increase long-acting insulin continue sliding scale. When she came in she was in acute kidney injury, my suspicion is that Nathan Swanson continues to improve her insulin requirement increase. LDL 58 HDL of 16 we'll start statin. Discontinue telemetry.  Essential hypertension: Blood pressure improving, goal blood pressures to be less than 130/80 as an outpatient. Continue hold lisinopril due to acute renal failure. His hydralazine when necessary for blood pressure: 180. Will allow a degree of permissive hypertension due to his acute kidney injury.  Microcytic anemia Ferritin is pending. Will need follow-up as an outpatient with his PCP.  AKI (acute kidney injury) [facility])  He relates he doesn't have any knowledge of having chronic renal disease. Unknown baseline. In the setting of ACE inhibitor use, question hypovolemia in the setting of ACE inhibitor use. Continue gentle IV hydration and continues to improve. Continue check basic metabolic panel.  Bilateral lower extremity edema: Likely due to chronic venous stasis she has skin hyperpigmentation and hypertrophy. 2-D echo done on 01/12/2016 show grade 1 diastolic heart failure with preserved ejection fraction. Lower ext Doppler on 01/12/2016 is pending, to rule out DVT   DVT prophylaxis: lovenox Family  Communication:none Disposition Plan/Barrier to D/C: home in 1-2 days Code Status:     Code Status Orders        Start     Ordered   01/11/16 0531  Full code  Continuous     01/11/16 0531    Code Status History    Date Active Date Inactive Code Status Order ID Comments User Context   This patient has a current code status but no historical code status.        IV Access:    Peripheral IV   Procedures and diagnostic studies:   US Renal  Result Date: 01/11/2016 CLINICAL DATA:  Patient with elevated Swanson. EXAM: RENAL / URINARY TRACT ULTRASOUND COMPLETE COMPARISON:  None. FINDINGS: Right Kidney: Length: 13.8 cm. Echogenicity within normal limits. No mass or hydronephrosis visualized. Left Kidney: Length: 12.3 cm. Echogenicity within normal limits. No mass or hydronephrosis visualized. Bladder: Appears normal for degree of bladder distention. IMPRESSION: No hydronephrosis. Electronically Signed   By: Lovey Newcomer M.D.   On: 01/11/2016 10:45     Medical Consultants:    None.  Anti-Infectives:   None  Subjective:    Nathan Swanson feels better.  Objective:    Vitals:   01/11/16 1928 01/11/16 1930 01/11/16 2110 01/12/16 0500  BP:  138/65 133/67 (!) 142/75  Pulse:  72 73 65  Resp:  (!) 22 16 20   Temp: 98.3 F (36.8 C)  98.8 F (37.1 C) 99.1 F (37.3 C)  TempSrc: Oral  Oral Oral  SpO2:  100% 99% 99%  Weight:      Height:        Intake/Output Summary (Last 24 hours) at 01/12/16 1204 Last data filed at 01/12/16  1975  Gross per 24 hour  Intake          2879.16 ml  Output              550 ml  Net          2329.16 ml   Filed Weights   01/11/16 0909  Weight: 128.8 kg (284 lb)    Exam: General exam: In no acute distress. Respiratory system: Good air movement and clear to auscultation. Cardiovascular system: S1 & S2 heard, RRR. No JVD. Gastrointestinal system: Abdomen is nondistended, soft and nontender.  Extremities:Trace edema Skin: No rashes,  lesions or ulcers Psychiatry: Judgement and insight appear normal. Mood & affect appropriate.    Data Reviewed:    Labs: Basic Metabolic Panel:  Recent Labs Lab 01/10/16 2142 01/11/16 0056 01/11/16 0646 01/12/16 0537  NA 124* 133* 142 138  K 5.4* 4.6 3.3* 3.5  CL 85* 94* 106 105  CO2 29 30 27 27   GLUCOSE 1,170* 842* 149* 307*  BUN 43* 41* 39* 33*  Swanson 2.69* 2.60* 2.71* 1.76*  CALCIUM 8.9 8.8* 8.6* 8.2*  MG  --   --  3.1*  --    GFR Estimated Swanson Clearance: 57.3 mL/min (by C-G formula based on SCr of 1.76 mg/dL (H)). Liver Function Tests:  Recent Labs Lab 01/12/16 0537  AST 28  ALT 26  ALKPHOS 69  BILITOT 0.7  PROT 6.8  ALBUMIN 3.5   No results for input(s): LIPASE, AMYLASE in the last 168 hours. No results for input(s): AMMONIA in the last 168 hours. Coagulation profile  Recent Labs Lab 01/12/16 0537  INR 0.99    CBC:  Recent Labs Lab 01/10/16 2208 01/12/16 0537  WBC 6.1 5.7  NEUTROABS  --  2.8  HGB 11.8* 10.9*  HCT 35.9* 34.6*  MCV 71.8* 75.2*  PLT 156 126*   Cardiac Enzymes: No results for input(s): CKTOTAL, CKMB, CKMBINDEX, TROPONINI in the last 168 hours. BNP (last 3 results) No results for input(s): PROBNP in the last 8760 hours. CBG:  Recent Labs Lab 01/11/16 1551 01/11/16 1927 01/11/16 2116 01/12/16 0734 01/12/16 1128  GLUCAP 214* 235* 278* 261* 318*   D-Dimer: No results for input(s): DDIMER in the last 72 hours. Hgb A1c:  Recent Labs  01/11/16 0646  HGBA1C 14.9*   Lipid Profile:  Recent Labs  01/12/16 0537  CHOL 151  HDL 16*  LDLCALC 57  TRIG 388*  CHOLHDL 9.4   Thyroid function studies:  Recent Labs  01/12/16 0537  TSH 0.486   Anemia work up:  Recent Labs  01/11/16 0646 01/12/16 0537  VITAMINB12  --  1,345*  FOLATE  --  18.6  FERRITIN 909*  --   TIBC  --  224*  IRON  --  70  RETICCTPCT  --  0.7   Sepsis Labs:  Recent Labs Lab 01/10/16 2208 01/12/16 0537  WBC 6.1 5.7    Microbiology No results found for this or any previous visit (from the past 240 hour(s)).   Medications:   . enoxaparin (LOVENOX) injection  60 mg Subcutaneous Daily  . insulin aspart  0-20 Units Subcutaneous TID WC  . insulin aspart  0-5 Units Subcutaneous QHS  . insulin aspart  3 Units Subcutaneous TID WC  . insulin glargine  25 Units Subcutaneous BID  . insulin starter kit- pen needles  1 kit Other Once  . senna-docusate  1 tablet Oral BID   Continuous Infusions:  Time spent: 25 min  LOS: 1 day   Charlynne Cousins  [facility] Pager [phone]  *Please refer to [email or web address], password [email or web address] to get updated schedule on who will round on this patient, as hospitalists switch teams weekly. If 7PM-7AM, please contact night-coverage at [email or web address], password [email or web address] for any overnight needs.  01/12/2016, 12:04 PM

## 2016-01-12 NOTE — Progress Notes (Signed)
Inpatient Diabetes Program Recommendations  AACE/ADA: New Consensus Statement on Inpatient Glycemic Control (2015)  Target Ranges:  Prepandial:   less than 140 mg/dL      Peak postprandial:   less than 180 mg/dL (1-2 hours)      Critically ill patients:  140 - 180 mg/dL   Lab Results  Component Value Date   GLUCAP 180 (H) 01/12/2016   HGBA1C 14.9 (H) 01/11/2016    Review of Glycemic Control  Demonstrated use of insulin pen and pt then returned demonstration. Answered questions. Has insulin pen starter kit. Will f/u on 11/28 am to review.  Thank you. Lorenda Peck, RD, LDN, CDE Inpatient Diabetes Coordinator [phone]

## 2016-01-12 NOTE — Progress Notes (Signed)
Date:  January 12, 2016 Discharge Planning: [employer] ext [phone] for insulin coverage ,message left to please call back tp [phone] Marcelle SmilingRhonda Davis, BSN, [place] MarRN3, [place]CCM   [phone]

## 2016-01-12 NOTE — Progress Notes (Signed)
  Echocardiogram 2D Echocardiogram has been performed.  Nathan Swanson 01/12/2016, 10:09 AM

## 2016-01-12 NOTE — Progress Notes (Signed)
  RD consulted for nutrition education regarding diabetes.   Lab Results  Component Value Date   HGBA1C 14.9 (H) 01/11/2016    RD provided "Carbohydrate Counting for People with Diabetes" handout from the Academy of Nutrition and Dietetics. Discussed different food groups and their effects on blood sugar, emphasizing carbohydrate-containing foods. Provided list of carbohydrates and recommended serving sizes of common foods.  Discussed importance of controlled and consistent carbohydrate intake throughout the day. Provided examples of ways to balance meals/snacks and encouraged intake of high-fiber, whole grain complex carbohydrates. Teach back method used.  Expect good compliance.  Body mass index is 36.46 kg/m. Pt meets criteria for Obesity II  based on current BMI.  Current diet order is HH/CHO, patient is consuming approximately 100% of meals at this time. Labs and medications reviewed. No further nutrition interventions warranted at this time. RD contact information provided. If additional nutrition issues arise, please re-consult RD.  Betsey Holidayasey Dereon Corkery, RD, LDN

## 2016-01-12 NOTE — Progress Notes (Signed)
**  Preliminary report by tech**  Bilateral lower extremity venous duplex complete. There is evidence of superficial vein thrombosis involving the right and left lesser saphenous vein. 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 There is no evidence of deep vein thrombosis involving the right and left lower extremities. There is no evidence of Baker's cysts bilaterally. Results were given to the patient's nurse, Annabelle Harmanana.  01/12/16 9:32 AM Olen CordialGreg Josette Shimabukuro RVT

## 2016-01-12 NOTE — Progress Notes (Signed)
Instructed pt on how to draw up insulin into syringe.  Demonstrated technique on drawing up insulin & how to administer SQ injection.  Pt verbalized understanding of insulin administration. Informed pt that RNs would continue to assist and educate him on how to administer insulin in preparation for dc. Maeola Harmanark, Tilak Oakley Johnson

## 2016-01-12 NOTE — Care Management Note (Signed)
Case Management Note  Patient Details  Name: Nathan Swanson MRN: [id number] Date of Birth: 25-Mar-1945  Subjective/Objective:   70 y/o m admitted w/DM. [place] [facility] benefit [place] pharmacy-copied [facility] card-in shadow chart. CM will explore meds for coverage-see DM ed note for meds to check(lantus,levimir novolog,humalog pens. [place]               Action/Plan:d/c plan home.   Expected Discharge Date:                  Expected Discharge Plan:  Home/Self Care  In-House Referral:     Discharge planning Services  CM Consult, Medication Assistance  Post Acute Care Choice:    Choice offered to:     DME Arranged:    DME Agency:     HH Arranged:    HH Agency:     Status of Service:  In process, will continue to follow  If discussed at Long Length of Stay Meetings, dates discussed:    Additional Comments:  Lanier ClamMahabir, Resean Brander, RN 01/12/2016, 1:24 PM

## 2016-01-12 NOTE — Progress Notes (Signed)
Inpatient Diabetes Program Recommendations  AACE/ADA: New Consensus Statement on Inpatient Glycemic Control (2015)  Target Ranges:  Prepandial:   less than 140 mg/dL      Peak postprandial:   less than 180 mg/dL (1-2 hours)      Critically ill patients:  140 - 180 mg/dL   Lab Results  Component Value Date   GLUCAP 318 (H) 01/12/2016   HGBA1C 14.9 (H) 01/11/2016    Review of Glycemic Control  Diabetes history: Newly-diagnosed Outpatient Diabetes medications: None Current orders for Inpatient glycemic control: Lantus 25 units bid, Novolog 0-20 units tidwc + 3 units tidwc for meal coverage.  Inpatient Diabetes Program Recommendations:    Increase Novolog to 8 units tidwc for meal coverage insulin. Will order OP Diabetes Education consult for newly-diagnosed DM Will order insulin pen starter kit for insulin pen administration   Spoke with patient about new diabetes diagnosis.  Discussed A1C results () and explained what an A1C is and informed patient that his current A1C is 14.9% and goal is < 7%,  Discussed basic pathophysiology of DM Type 2, basic home care, importance of checking CBGs and maintaining good CBG control to prevent long-term and short-term complications. Reviewed glucose and A1C goals and explained that patient will need to continue to  Reviewed signs and symptoms of hyperglycemia and hypoglycemia along with treatment for both. Discussed impact of nutrition, exercise, stress, sickness, and medications on diabetes control. Living Well with diabetes booklet given and encouraged patient to read through entire book.encouraged patient to go to [employer] to get the Reli-On Prime glucometer for $9 and a box of 50 Reli-On test strips for $9.  Asked patient to check his glucose 4 times per day (before meals and at bedtime) and to keep a log book of glucose readings and insulin taken. Explained how the doctor he follows up with can use the log book to continue to make insulin adjustments  if needed. Informed patient that RN will be asking him to self-administer insulin to ensure proper technique and ability to administer self insulin shots.   Patient verbalized understanding of information discussed and he states that he has no further questions at this time related to diabetes.   RNs to provide ongoing basic DM education at bedside with this patient and engage patient to actively check blood glucose and administer insulin injections.   Will teach insulin pen administration this afternoon. Will ask Case Manager to check on insurance copays with insulin pens.  Continue to follow.  Thank you. Lorenda Peck, RD, LDN, CDE Inpatient Diabetes Coordinator [phone]

## 2016-01-13 LAB — BASIC METABOLIC PANEL
Anion gap: 4 — ABNORMAL LOW (ref 5–15)
BUN: 22 mg/dL — AB (ref 6–20)
CHLORIDE: 107 mmol/L (ref 101–111)
CO2: 26 mmol/L (ref 22–32)
Calcium: 8.4 mg/dL — ABNORMAL LOW (ref 8.9–10.3)
Creatinine, Ser: 1.29 mg/dL — ABNORMAL HIGH (ref 0.61–1.24)
GFR calc Af Amer: >60 mL/min (ref >60)
GFR calc non Af Amer: 55 mL/min — ABNORMAL LOW (ref >60)
Glucose, Bld: 256 mg/dL — ABNORMAL HIGH (ref 65–99)
POTASSIUM: 3.6 mmol/L (ref 3.5–5.1)
SODIUM: 137 mmol/L (ref 135–145)

## 2016-01-13 LAB — GLUCOSE, CAPILLARY
GLUCOSE-CAPILLARY: 221 mg/dL — AB (ref 65–99)
GLUCOSE-CAPILLARY: 179 mg/dL — AB (ref 65–99)
Glucose-Capillary: 301 mg/dL — ABNORMAL HIGH (ref 65–99)

## 2016-01-13 MED ORDER — INSULIN GLARGINE 100 UNIT/ML ~~LOC~~ SOLN: 35.0000 [IU] | Freq: Two times a day (BID) | SUBCUTANEOUS | 11 refills | Status: AC

## 2016-01-13 MED ORDER — INSULIN ASPART 100 UNIT/ML ~~LOC~~ SOLN: 0.0000 [IU] | Freq: Three times a day (TID) | SUBCUTANEOUS | 11 refills | Status: AC

## 2016-01-13 MED ORDER — ATORVASTATIN CALCIUM 20 MG PO TABS: 20.0000 mg | ORAL_TABLET | Freq: Every day | ORAL | 1 refills | Status: DC

## 2016-01-13 NOTE — Discharge Summary (Signed)
Physician Discharge Summary  Nathan Swanson ONG:[id number] DOB: 05-25-1945 DOA: 01/10/2016  PCP: No PCP Per Patient  Admit date: 01/10/2016 Discharge date: 01/13/2016  Admitted From: Home Discharge disposition: Home   Recommendations for Outpatient Follow-Up:   1. Patient was encouraged to follow-up with his PCP upon arrival home [facility]).   Discharge Diagnosis:   Principal Problem:    Type 2 diabetes mellitus with hyperosmolarity without coma, without long-term current use of insulin (HCC) Active Problems:    Essential hypertension    Microcytic anemia    AKI (acute kidney injury) (HCC)   Discharge Condition: Improved.  Diet recommendation: Low sodium, heart healthy.  Carbohydrate-modified.    History of Present Illness:   The patient is a 70 year old man presented to the hospital with chief complaint of weakness accompanied by polyuria, polydipsia and dizziness. Markedly hyperglycemic with a glucose of 1170 on admission.   Hospital Course by Problem:   Principal problem:  Newly diagnosed Type 2 diabetes mellitus with hyperosmolarity without coma, without long-term current use of insulin (HCC) Patient is being discharged on 35 units of Lantus twice a day, 5 units of NovoLog meal coverage along with an saloon resistant sliding scale. He was seen by the diabetes coordinator was able to successfully calculate his insulin dose prior to discharge.  Active problems:  Essential hypertension: Blood pressure improving, goal blood pressure to be less than 130/80 as an outpatient. Okay to resume lisinopril at discharge, which was held due to acute kidney injury in the setting of osmotic diuresis.  Microcytic anemia Ferritin is 909 and there has not been any evidence of B-12 or serum folate deficiency. These findings are consistent with anemia of chronic disease. Will need follow-up as an outpatient with his PCP.  AKI (acute kidney injury) (HCC)  Appears to be  from dehydration from osmotic diuresis in the setting of ongoing ACE inhibitor use. Creatinine improved with hydration and holding lisinopril.  Bilateral lower extremity edema: Likely due to chronic venous stasis she has skin hyperpigmentation and hypertrophy. 2-D echo done on 01/12/2016 showed grade 1 diastolic heart failure with preserved ejection fraction. Lower ext Doppler on 01/12/2016 was negative for DVT.  Medical Consultants:    None.   Discharge Exam:   Vitals:   01/13/16 0438 01/13/16 1400  BP: 122/76 (!) 153/89  Pulse: 72 82  Resp: 20 16  Temp: 98.3 F (36.8 C) 98.3 F (36.8 C)   Vitals:   01/12/16 1327 01/12/16 2152 01/13/16 0438 01/13/16 1400  BP: (!) 142/79 130/76 122/76 (!) 153/89  Pulse: 79 63 72 82  Resp: 20 20 20 16   Temp: 98.9 F (37.2 C) 98.1 F (36.7 C) 98.3 F (36.8 C) 98.3 F (36.8 C)  TempSrc: Oral Oral Oral Oral  SpO2: 100% 99% 100% 99%  Weight:      Height:        General exam: Appears calm and comfortable.  Respiratory system: Clear to auscultation. Respiratory effort normal. Cardiovascular system: S1 & S2 heard, RRR. No JVD,  rubs, gallops or clicks. No murmurs. Gastrointestinal system: Abdomen is nondistended, soft and nontender. No organomegaly or masses felt. Normal bowel sounds heard. Central nervous system: Alert and oriented. No focal neurological deficits. Extremities: No clubbing,  or cyanosis. 1+ edema with venous stasis changes. Skin: No rashes, lesions or ulcers. Psychiatry: Judgement and insight appear normal. Mood & affect appropriate.    The results of significant diagnostics from this hospitalization (including imaging, microbiology, ancillary and laboratory) are listed below for  reference.     Procedures and Diagnostic Studies:   [place] Renal  Result Date: 01/11/2016 CLINICAL DATA:  Patient with elevated creatinine. EXAM: RENAL / URINARY TRACT ULTRASOUND COMPLETE COMPARISON:  None. FINDINGS: Right Kidney: Length: 13.8  cm. Echogenicity within normal limits. No mass or hydronephrosis visualized. Left Kidney: Length: 12.3 cm. Echogenicity within normal limits. No mass or hydronephrosis visualized. Bladder: Appears normal for degree of bladder distention. IMPRESSION: No hydronephrosis. Electronically Signed   By: Annia Beltrew  Davis M.D.   On: 01/11/2016 10:45     Labs:   Basic Metabolic Panel:  Recent Labs Lab 01/10/16 2142 01/11/16 0056 01/11/16 0646 01/12/16 0537 01/13/16 0524  NA 124* 133* 142 138 137  K 5.4* 4.6 3.3* 3.5 3.6  CL 85* 94* 106 105 107  CO2 29 30 27 27 26   GLUCOSE 1,170* 842* 149* 307* 256*  BUN 43* 41* 39* 33* 22*  CREATININE 2.69* 2.60* 2.71* 1.76* 1.29*  CALCIUM 8.9 8.8* 8.6* 8.2* 8.4*  MG  --   --  3.1*  --   --    GFR Estimated Creatinine Clearance: 78.1 mL/min (by C-G formula based on SCr of 1.29 mg/dL (H)). Liver Function Tests:  Recent Labs Lab 01/12/16 0537  AST 28  ALT 26  ALKPHOS 69  BILITOT 0.7  PROT 6.8  ALBUMIN 3.5   No results for input(s): LIPASE, AMYLASE in the last 168 hours. No results for input(s): AMMONIA in the last 168 hours. Coagulation profile  Recent Labs Lab 01/12/16 0537  INR 0.99    CBC:  Recent Labs Lab 01/10/16 2208 01/12/16 0537  WBC 6.1 5.7  NEUTROABS  --  2.8  HGB 11.8* 10.9*  HCT 35.9* 34.6*  MCV 71.8* 75.2*  PLT 156 126*   Cardiac Enzymes: No results for input(s): CKTOTAL, CKMB, CKMBINDEX, TROPONINI in the last 168 hours. BNP: Invalid input(s): POCBNP CBG:  Recent Labs Lab 01/12/16 1128 01/12/16 1650 01/12/16 2137 01/13/16 0751 01/13/16 1144  GLUCAP 318* 180* 174* 221* 301*   D-Dimer No results for input(s): DDIMER in the last 72 hours. Hgb A1c  Recent Labs  01/11/16 0646  HGBA1C 14.9*   Lipid Profile  Recent Labs  01/12/16 0537  CHOL 151  HDL 16*  LDLCALC 57  TRIG 295388*  CHOLHDL 9.4   Thyroid function studies  Recent Labs  01/12/16 0537  TSH 0.486   Anemia work up  Recent Labs   01/11/16 0646 01/12/16 0537  VITAMINB12  --  1,345*  FOLATE  --  18.6  FERRITIN 909*  --   TIBC  --  224*  IRON  --  70  RETICCTPCT  --  0.7   Microbiology No results found for this or any previous visit (from the past 240 hour(s)).   Discharge Instructions:   Discharge Instructions    Ambulatory referral to Nutrition and Diabetic Education    Complete by:  As directed    Call MD for:    Complete by:  As directed    Call your primary care doctor if your blood glucoses are consistently greater than 250. Keep a log of your blood sugar readings and bring these with you to your next doctor's appointment.   Call MD for:  extreme fatigue    Complete by:  As directed    Diet Carb Modified    Complete by:  As directed    Discharge instructions    Complete by:  As directed    Before each meal, give yourself  5 units of insulin in addition to the insulin specified on the following sliding scale: (CBG = capillary blood glucose)  If CBG < 70: Drink juice; CBG 70-120: 0 u; CBG 121-150: 3 u; CBG 151-200: 4 u; CBG 201-250: 7 u; CBG 251-300: 11 u;CBG 301-350: 15 u; CBG 351-400: 20 u; CBG > 400: call MD   Increase activity slowly    Complete by:  As directed        Medication List    TAKE these medications   insulin aspart 100 UNIT/ML injection Commonly known as:  novoLOG Inject 0-20 Units into the skin 3 (three) times daily with meals. Please dispense insulin syringes #100, a glucometer, and testing supplies. Please see separate instructions for how to administer insulin.   insulin glargine 100 UNIT/ML injection Commonly known as:  LANTUS Inject 0.35 mLs (35 Units total) into the skin 2 (two) times daily.   latanoprost 0.005 % ophthalmic solution Commonly known as:  XALATAN Place 1 drop into both eyes at bedtime.   LISINOPRIL PO Take 2 tablets by mouth daily.   loratadine 10 MG tablet Commonly known as:  CLARITIN Take 10 mg by mouth daily.   TIMOLOL MALEATE OP Place 1 drop  into the left eye daily.   VITAMIN D PO Take 1 tablet by mouth daily.      Follow-up Information    Your PCP. Schedule an appointment as soon as possible for a visit.   Why:  As soon as you get home. Bring a log of your blood sugars with you.           Time coordinating discharge: Greater than 35 minutes.  Signed:  Jozelynn Danielson  Pager [phone] [facility] 01/13/2016, 2:45 PM

## 2016-01-13 NOTE — Discharge Instructions (Signed)
Diabetes Mellitus and Standards of Medical Care Managing diabetes (diabetes mellitus) can be complicated. Your diabetes treatment may be managed by a team of health care providers, including:  A diet and nutrition specialist (registered dietitian).  A nurse.  A certified diabetes educator (CDE).  A diabetes specialist (endocrinologist).  An eye doctor.  A primary care provider.  A dentist. Your health care providers follow a schedule in order to help you get the best quality of care. The following schedule is a general guideline for your diabetes management plan. Your health care providers may also give you more specific instructions. HbA1c ( hemoglobin A1c) test This test provides information about blood sugar (glucose) control over the previous 2-3 months. It is used to check whether your diabetes management plan needs to be adjusted.  If you are meeting your treatment goals, this test is done at least 2 times a year.  If you are not meeting treatment goals or if your treatment goals have changed, this test is done 4 times a year. Blood pressure test  This test is done at every routine medical visit. For most people, the goal is less than 140/90. In some cases, your goal blood pressure may be 130/80 or less. Ask your health care provider what your goal blood pressure should be. Dental and eye exams  Visit your dentist two times a year.  If you have type 1 diabetes, get an eye exam 3-5 years after you are diagnosed, and then once a year after your first exam.  If you were diagnosed with type 1 diabetes as a child, get an eye exam when you are age 39 or older and have had diabetes for 3-5 years. After the first exam, you should get an eye exam once a year.  If you have type 2 diabetes, have an eye exam as soon as you are diagnosed, and then once a year after your first exam. Foot care exam  Visual foot exams are done at every routine medical visit. The exams check for cuts,  bruises, redness, blisters, sores, or other problems with the feet.  A complete foot exam is done by your health care provider once a year. This exam includes an inspection of the structure and skin of your feet, and a check of the pulses and sensation in your feet.  Type 1 diabetes: Get your first exam 3-5 years after diagnosis.  Type 2 diabetes: Get your first exam as soon as you are diagnosed.  Check your feet every day for cuts, bruises, redness, blisters, or sores. If you have any of these or other problems that are not healing, contact your health care provider. Kidney function test ( urine microalbumin)  This test is done once a year.  Type 1 diabetes: Get your first test 5 years after diagnosis.  Type 2 diabetes: Get your first test as soon as you are diagnosed.  If you have chronic kidney disease (CKD), get a serum creatinine and estimated glomerular filtration rate (eGFR) test once a year. Lipid profile (cholesterol, HDL, LDL, triglycerides)  This test should be done when you are diagnosed with diabetes, and every 5 years after the first test. If you are on medicines to lower your cholesterol, you may need to get this test done every year.  The goal for LDL is less than 100 mg/dL (5.5 mmol/L). If you are at high risk, the goal is less than 70 mg/dL (3.9 mmol/L).  The goal for HDL is 40 mg/dL (2.2 mmol/L)  for men and 50 mg/dL(2.8 mmol/L) for women. An HDL cholesterol of 60 mg/dL (3.3 mmol/L) or higher gives some protection against heart disease.  The goal for triglycerides is less than 150 mg/dL (8.3 mmol/L). Immunizations  The yearly flu (influenza) vaccine is recommended for everyone 6 months or older who has diabetes.  The pneumonia (pneumococcal) vaccine is recommended for everyone 2 years or older who has diabetes. If you are 16 or older, you may get the pneumonia vaccine as a series of two separate shots.  The hepatitis B vaccine is recommended for adults shortly  after they have been diagnosed with diabetes.  The Tdap (tetanus, diphtheria, and pertussis) vaccine should be given:  According to normal childhood vaccination schedules, for children.  Every 10 years, for adults who have diabetes.  The shingles vaccine is recommended for people who have had chicken pox and are 50 years or older. Mental and emotional health  Screening for symptoms of eating disorders, anxiety, and depression is recommended at the time of diagnosis and afterward as needed. If your screening shows that you have symptoms (you have a positive screening result), you may need further evaluation and be referred to a mental health care provider. Diabetes self-management education  Education about how to manage your diabetes is recommended at diagnosis and ongoing as needed. Treatment plan  Your treatment plan will be reviewed at every medical visit. Summary  Managing diabetes (diabetes mellitus) can be complicated. Your diabetes treatment may be managed by a team of health care providers.  Your health care providers follow a schedule in order to help you get the best quality of care.  Standards of care including having regular physical exams, blood tests, blood pressure monitoring, immunizations, screening tests, and education about how to manage your diabetes.  Your health care providers may also give you more specific instructions based on your individual health. This information is not intended to replace advice given to you by your health care provider. Make sure you discuss any questions you have with your health care provider. Document Released: 11/29/2008 Document Revised: 10/31/2015 Document Reviewed: 10/31/2015 Elsevier Interactive Patient Education  2017 [employer].   Hyperglycemia Hyperglycemia occurs when the level of sugar (glucose) in the blood is too high. Glucose is a type of sugar that provides the body's main source of energy. Certain hormones (insulin  and glucagon) control the level of glucose in the blood. Insulin lowers blood glucose, and glucagon increases blood glucose. Hyperglycemia can result from having too little insulin in the bloodstream, or from the body not responding normally to insulin. Hyperglycemia occurs most often in people who have diabetes (diabetes mellitus), but it can happen in people who do not have diabetes. It can develop quickly, and it can be life-threatening if it causes you to become severely dehydrated (diabetic ketoacidosis or hyperglycemic hyperosmolar state). Severe hyperglycemia is a medical emergency. What are the causes? If you have diabetes, hyperglycemia may be caused by:  Diabetes medicine.  Medicines that increase blood glucose or affect your diabetes control.  Not eating enough, or not eating often enough.  Changes in physical activity level.  Being sick or having an infection. If you have prediabetes or undiagnosed diabetes:  Hyperglycemia may be caused by those conditions. If you do not have diabetes, hyperglycemia may be caused by:  Certain medicines, including steroid medicines, beta-blockers, epinephrine, and thiazide diuretics.  Stress.  Serious illness.  Surgery.  Diseases of the pancreas.  Infection. What increases the risk? Hyperglycemia is  more likely to develop in people who have risk factors for diabetes, such as:  Having a family member with diabetes.  Having a gene for type 1 diabetes that is passed from parent to child (inherited).  Living in an area with cold weather conditions.  Exposure to certain viruses.  Certain conditions in which the body's disease-fighting (immune) system attacks itself (autoimmune disorders).  Being overweight or obese.  Having an inactive (sedentary) lifestyle.  Having been diagnosed with insulin resistance.  Having a history of prediabetes, gestational diabetes, or polycystic ovarian syndrome (PCOS).  Being of American-Indian,  African-American, Hispanic/Latino, or Asian/Pacific Islander descent. What are the signs or symptoms? Hyperglycemia may not cause any symptoms. If you do have symptoms, they may include early warning signs, such as:  Increased thirst.  Hunger.  Feeling very tired.  Needing to urinate more often than usual.  Blurry vision. Other symptoms may develop if hyperglycemia gets worse, such as:  Dry mouth.  Loss of appetite.  Fruity-smelling breath.  Weakness.  Unexpected or rapid weight gain or weight loss.  Tingling or numbness in the hands or feet.  Headache.  Skin that does not quickly return to normal after being lightly pinched and released (poor skin turgor).  Abdominal pain.  Cuts or bruises that are slow to heal. How is this diagnosed? Hyperglycemia is diagnosed with a blood test to measure your blood glucose level. This blood test is usually done while you are having symptoms. Your health care provider may also do a physical exam and review your medical history. You may have more tests to determine the cause of your hyperglycemia, such as:  A fasting blood glucose (FBG) test. You will not be allowed to eat (you will fast) for at least 8 hours before a blood sample is taken.  An A1c (hemoglobin A1c) blood test. This provides information about blood glucose control over the previous 2-3 months.  An oral glucose tolerance test (OGTT). This measures your blood glucose at two times:  After fasting. This is your baseline blood glucose level.  Two hours after drinking a beverage that contains glucose. How is this treated? Treatment depends on the cause of your hyperglycemia. Treatment may include:  Taking medicine to regulate your blood glucose levels. If you take insulin or other diabetes medicines, your medicine or dosage may be adjusted.  Lifestyle changes, such as exercising more, eating healthier foods, or losing weight.  Treating an illness or infection, if this  caused your hyperglycemia.  Checking your blood glucose more often.  Stopping or reducing steroid medicines, if these caused your hyperglycemia. If your hyperglycemia becomes severe and it results in hyperglycemic hyperosmolar state, you must be hospitalized and given IV fluids. Follow these instructions at home: General instructions  Take over-the-counter and prescription medicines only as told by your health care provider.  Do not use any products that contain nicotine or tobacco, such as cigarettes and e-cigarettes. If you need help quitting, ask your health care provider.  Limit alcohol intake to no more than 1 drink per day for nonpregnant women and 2 drinks per day for men. One drink equals 12 oz of beer, 5 oz of wine, or 1 oz of hard liquor.  Learn to manage stress. If you need help with this, ask your health care provider.  Keep all follow-up visits as told by your health care provider. This is important. Eating and drinking  Maintain a healthy weight.  Exercise regularly, as directed by your health care provider.  Stay hydrated, especially when you exercise, get sick, or spend time in hot temperatures.  Eat healthy foods, such as:  Lean proteins.  Complex carbohydrates.  Fresh fruits and vegetables.  Low-fat dairy products.  Healthy fats.  Drink enough fluid to keep your urine clear or pale yellow. If you have diabetes:   Make sure you know the symptoms of hyperglycemia.  Follow your diabetes management plan, as told by your health care provider. Make sure you:  Take your insulin and medicines as directed.  Follow your exercise plan.  Follow your meal plan. Eat on time, and do not skip meals.  Check your blood glucose as often as directed. Make sure to check your blood glucose before and after exercise. If you exercise longer or in a different way than usual, check your blood glucose more often.  Follow your sick day plan whenever you cannot eat or drink  normally. Make this plan in advance with your health care provider.  Share your diabetes management plan with people in your workplace, school, and household.  Check your urine for ketones when you are ill and as told by your health care provider.  Carry a medical alert card or wear medical alert jewelry. Contact a health care provider if:  Your blood glucose is at or above 240 mg/dL (13.3 mmol/L) for 2 days in a row.  You have problems keeping your blood glucose in your target range.  You have frequent episodes of hyperglycemia. Get help right away if:  You have difficulty breathing.  You have a change in how you think, feel, or act (mental status).  You have nausea or vomiting that does not go away. These symptoms may represent a serious problem that is an emergency. Do not wait to see if the symptoms will go away. Get medical help right away. Call your local emergency services (911 in the [place] Do not drive yourself to the hospital.  Summary  Hyperglycemia occurs when the level of sugar (glucose) in the blood is too high.  Hyperglycemia is diagnosed with a blood test to measure your blood glucose level. This blood test is usually done while you are having symptoms. Your health care provider may also do a physical exam and review your medical history.  If you have diabetes, follow your diabetes management plan as told by your health care provider.  Contact your health care provider if you have problems keeping your blood glucose in your target range. This information is not intended to replace advice given to you by your health care provider. Make sure you discuss any questions you have with your health care provider. Document Released: 07/28/2000 Document Revised: 10/20/2015 Document Reviewed: 10/20/2015 Elsevier Interactive Patient Education  2017 [employer].

## 2016-01-13 NOTE — Care Management Note (Signed)
Case Management Note  Patient Details  Name: Modena MorrowWillie Cue MRN: [id number] Date of Birth: 27-Sep-1945  Subjective/Objective:   [facility]-spoke to Pharmacy(Kenny) tel#[phone]-their process for filling scripts:[facility] [facility] [address] [place] [id number] open till 4:30p-input system 1st, then to see a doctor-[facility] only honor their scripts-the [facility] doctor would have to re-write the scripts-then patient can take the [facility] script to the pharmacy to fill-they do have insulin  Pens for lantus.Patient informed & has info to decide if he wants to use his VA benefit or use his medicare benefit & go to any pharmacy. Patient voiced udnerstanding. Provided patient w/listing of general pharmacies.No further CM needs.                 Action/Plan:d/c home.   Expected Discharge Date:                  Expected Discharge Plan:  Home/Self Care  In-House Referral:     Discharge planning Services  CM Consult, Medication Assistance  Post Acute Care Choice:    Choice offered to:     DME Arranged:    DME Agency:     HH Arranged:    HH Agency:     Status of Service:  Completed, signed off  If discussed at [employer] Length of Stay Meetings, dates discussed:    Additional Comments:  Lanier ClamMahabir, Riyanshi Wahab, RN 01/13/2016, 12:47 PM

## 2016-01-14 LAB — HEMOGLOBIN A1C
Hgb A1c MFr Bld: 15.4 % — ABNORMAL HIGH (ref 4.8–5.6)
MEAN PLASMA GLUCOSE: 395 mg/dL

## 2016-12-08 IMAGING — US US RENAL
1 series · 14 of 25 positions shown · non-contrast
Comparison: None.

CLINICAL DATA: Patient with elevated creatinine.

EXAM:
RENAL / URINARY TRACT ULTRASOUND COMPLETE

[Series 1: us renal · 0.26mm/px · 14 of 41 slices shown]
[im 1/41]
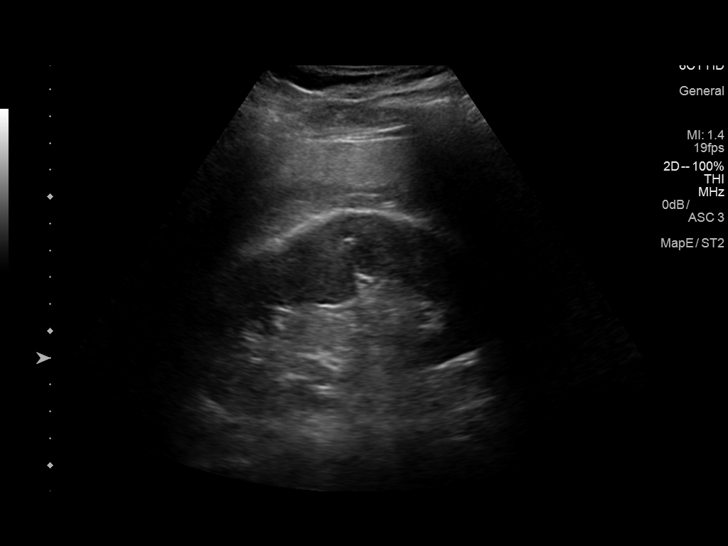
[im 4/41]
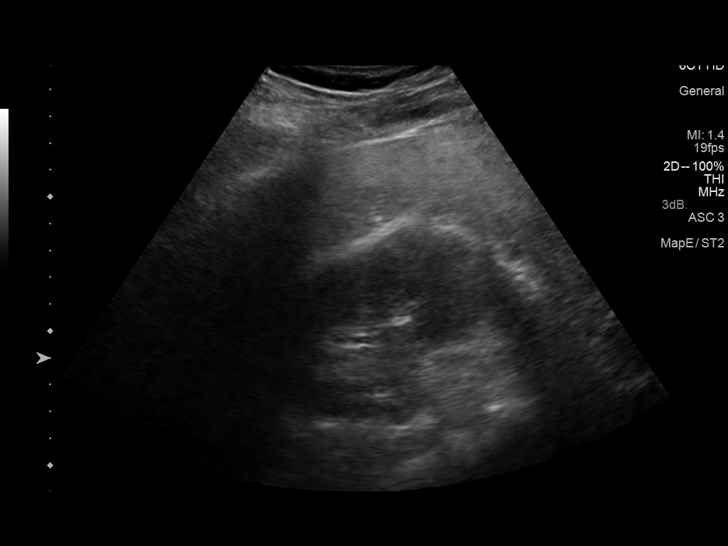
[im 7/41]
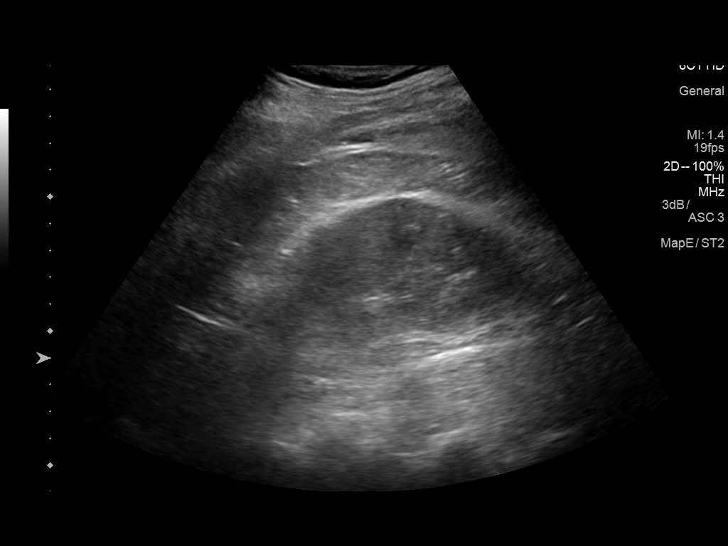
[im 11/41]
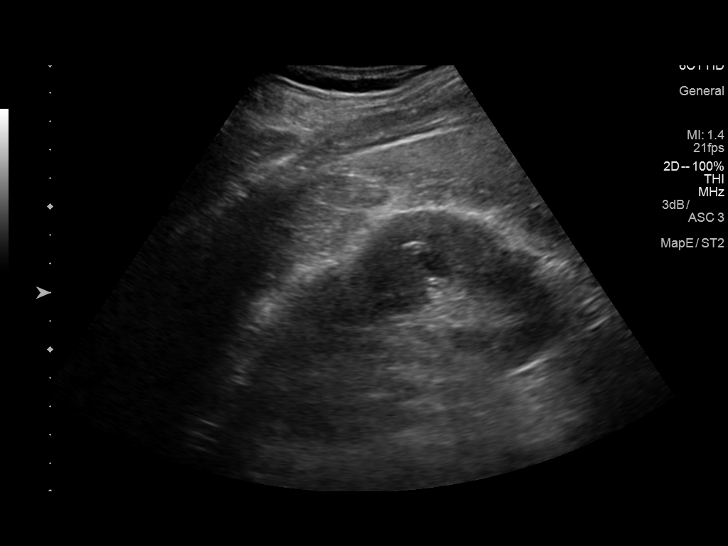
[im 14/41]
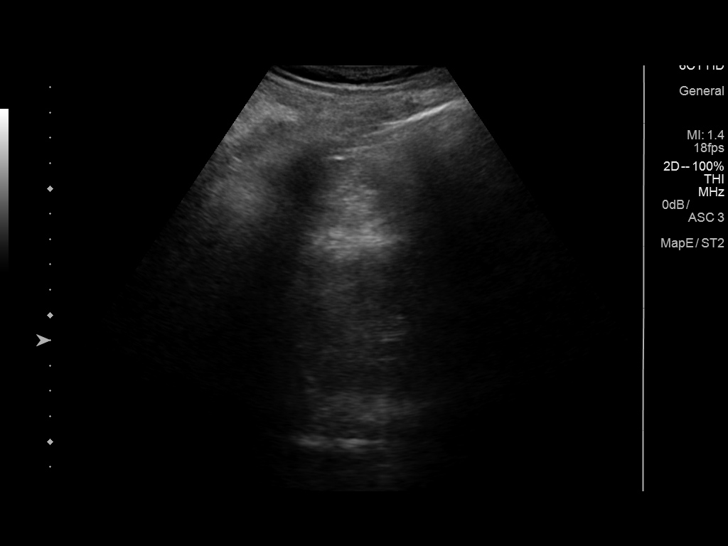
[im 16/41]
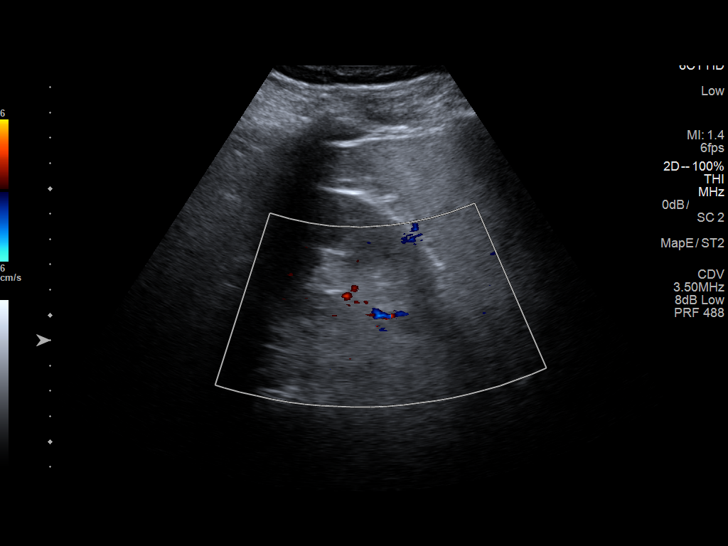
[im 19/41]
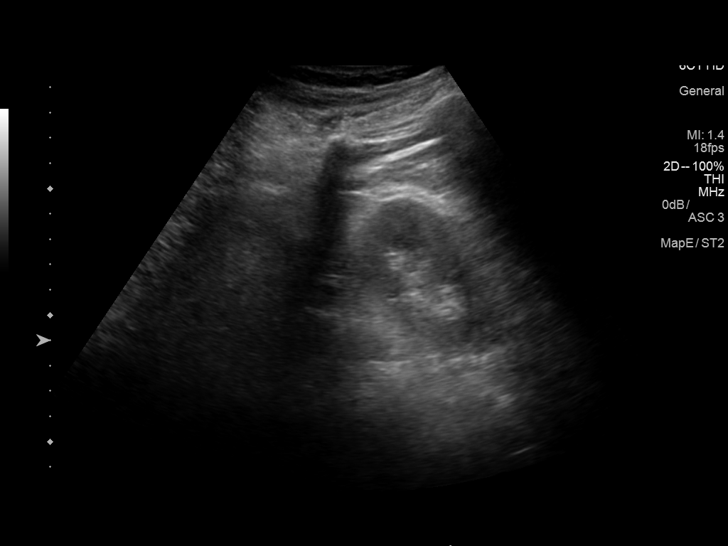
[im 22/41]
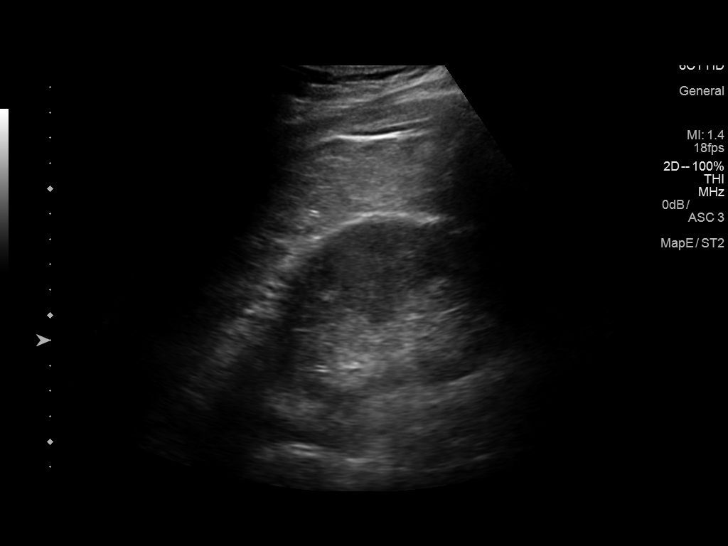
[im 26/41]
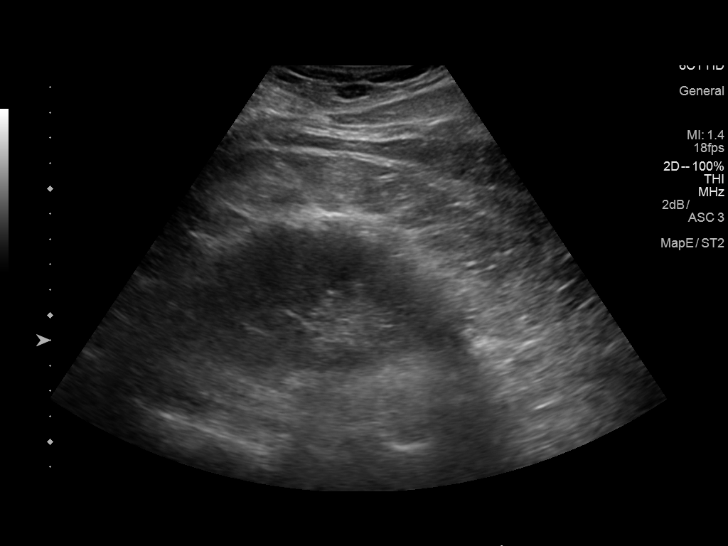
[im 27/41]
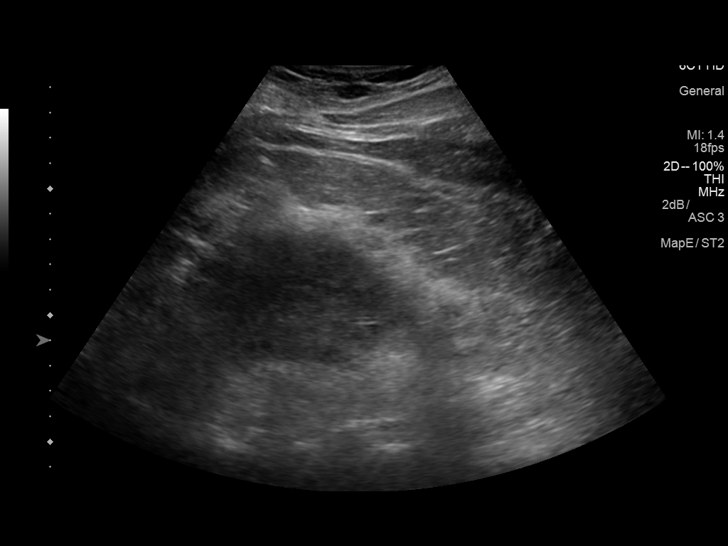
[im 31/41]
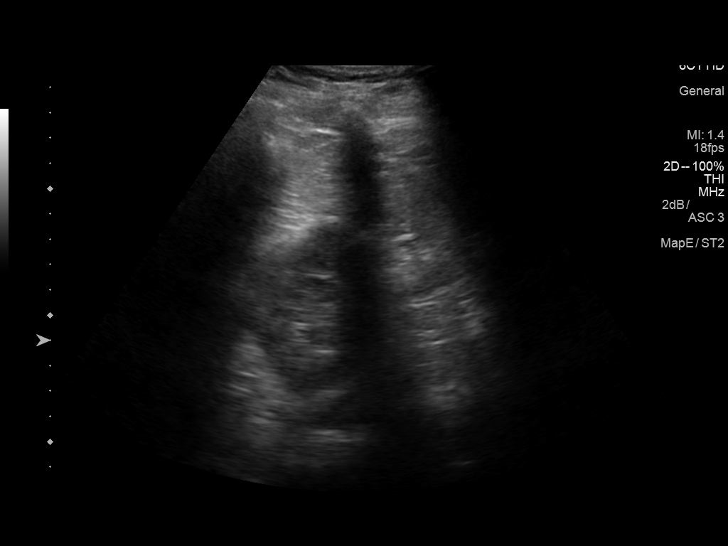
[im 34/41]
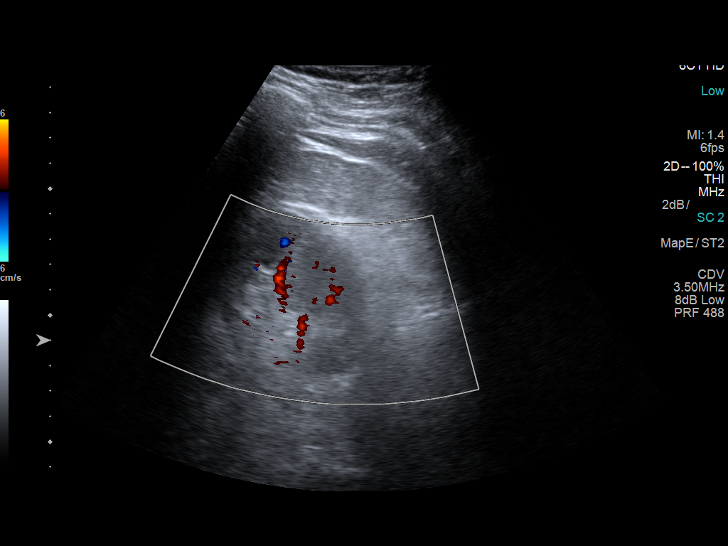
[im 37/41]
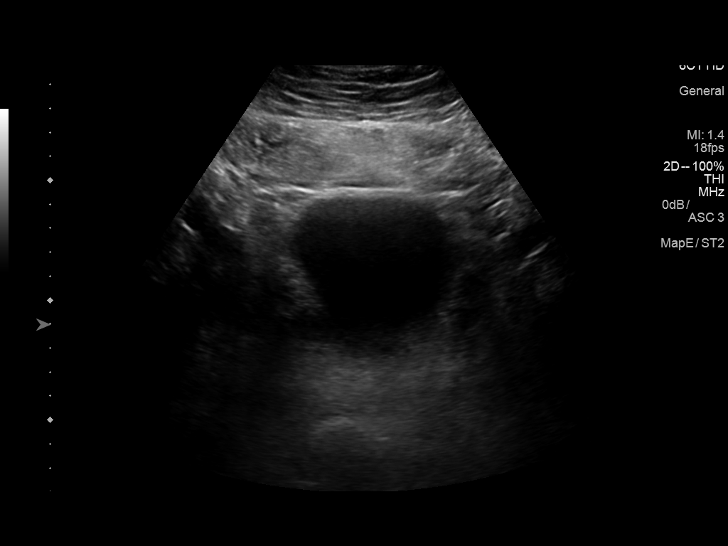
[im 41/41]
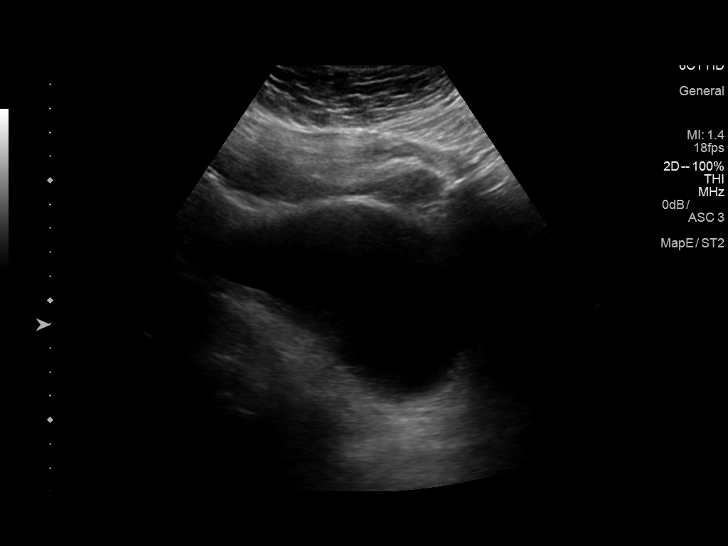

[14 of 25 positions shown; findings below may reference images not displayed]

FINDINGS: Right Kidney:

Length: 13.8 cm. Echogenicity within normal limits. No mass or
hydronephrosis visualized.

Left Kidney:

Length: 12.3 cm. Echogenicity within normal limits. No mass or
hydronephrosis visualized.

Bladder:

Appears normal for degree of bladder distention.
IMPRESSION: No hydronephrosis.

## 2020-08-04 ENCOUNTER — Other Ambulatory Visit: Payer: Self-pay

## 2020-08-04 DIAGNOSIS — Z0000 Encounter for general adult medical examination without abnormal findings: Secondary | ICD-10-CM

## 2020-08-05 LAB — CULTURE RESPIRATORY

## 2020-08-06 LAB — CULTURE SURVEILLANCE, MRSA

## 2020-08-06 LAB — CULTURE RESPIRATORY

## 2020-08-07 LAB — CULTURE RESPIRATORY

## 2020-11-20 ENCOUNTER — Telehealth (EMERGENCY_DEPARTMENT_HOSPITAL): Payer: Self-pay | Admitting: Neurology

## 2020-11-20 DIAGNOSIS — R471 Dysarthria and anarthria: Secondary | ICD-10-CM

## 2020-11-20 DIAGNOSIS — Z8679 Personal history of other diseases of the circulatory system: Secondary | ICD-10-CM

## 2020-11-20 DIAGNOSIS — R2990 Unspecified symptoms and signs involving the nervous system: Secondary | ICD-10-CM

## 2020-11-20 DIAGNOSIS — I959 Hypotension, unspecified: Secondary | ICD-10-CM

## 2020-11-20 NOTE — Progress Notes (Signed)
Teleneurology Note - Stroke    Requesting Facility -  [facility]  Requesting Physician - Dr. Jamison Neighbor  Reason Consultation - stroke alert     Cancellation note:  A stroke alert was called for this patient at the same time as another at [facility]. On the referring MD's chart review for Mr. Clites. 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 He was also hypotensive with a BP in the 60s initially and improving (resolving dysarthria/slurred speech) with a return of higher Bps, possibly from taking too many antihypertensive and hx of prior to stroke though wife is coming to confirm.    No hemorrhage on CTH. 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 The referring MD agreed with cancelling the telestroke alert and a plan for her to call back if the CTA showed evidence of a LVO stroke to consider NIR as per our typical protocol for non-thrombolysis patients.
